# Patient Record
Sex: Female | Born: 1970 | Race: Black or African American | Hispanic: No | Marital: Single | Smoking: Never smoker
Health system: Southern US, Community
[De-identification: ages and names within clinical notes are randomized; demographics above are authoritative.]

## PROBLEM LIST (undated history)

## (undated) DIAGNOSIS — K219 Gastro-esophageal reflux disease without esophagitis: Secondary | ICD-10-CM

## (undated) DIAGNOSIS — J4520 Mild intermittent asthma, uncomplicated: Secondary | ICD-10-CM

## (undated) DIAGNOSIS — H7290 Unspecified perforation of tympanic membrane, unspecified ear: Secondary | ICD-10-CM

## (undated) DIAGNOSIS — Z87828 Personal history of other (healed) physical injury and trauma: Secondary | ICD-10-CM

## (undated) DIAGNOSIS — J302 Other seasonal allergic rhinitis: Secondary | ICD-10-CM

## (undated) DIAGNOSIS — H5210 Myopia, unspecified eye: Secondary | ICD-10-CM

## (undated) DIAGNOSIS — J019 Acute sinusitis, unspecified: Secondary | ICD-10-CM

## (undated) DIAGNOSIS — H52209 Unspecified astigmatism, unspecified eye: Secondary | ICD-10-CM

## (undated) DIAGNOSIS — J3089 Other allergic rhinitis: Secondary | ICD-10-CM

## (undated) DIAGNOSIS — M25862 Other specified joint disorders, left knee: Secondary | ICD-10-CM

## (undated) DIAGNOSIS — Z8742 Personal history of other diseases of the female genital tract: Secondary | ICD-10-CM

## (undated) DIAGNOSIS — E663 Overweight: Secondary | ICD-10-CM

## (undated) DIAGNOSIS — J309 Allergic rhinitis, unspecified: Secondary | ICD-10-CM

## (undated) DIAGNOSIS — M25571 Pain in right ankle and joints of right foot: Secondary | ICD-10-CM

## (undated) DIAGNOSIS — K279 Peptic ulcer, site unspecified, unspecified as acute or chronic, without hemorrhage or perforation: Secondary | ICD-10-CM

## (undated) HISTORY — DX: Personal history of other diseases of the female genital tract: Z87.42

## (undated) HISTORY — DX: Other seasonal allergic rhinitis: J30.2

## (undated) HISTORY — DX: Mild intermittent asthma, uncomplicated: J45.20

## (undated) HISTORY — DX: Overweight: E66.3

## (undated) HISTORY — DX: Allergic rhinitis, unspecified: J30.9

## (undated) HISTORY — DX: Myopia, unspecified eye: H52.10

## (undated) HISTORY — DX: Acute sinusitis, unspecified: J01.90

## (undated) HISTORY — DX: Gastro-esophageal reflux disease without esophagitis: K21.9

## (undated) HISTORY — DX: Pain in right ankle and joints of right foot: M25.571

## (undated) HISTORY — DX: Unspecified astigmatism, unspecified eye: H52.209

## (undated) HISTORY — DX: Personal history of other (healed) physical injury and trauma: Z87.828

## (undated) HISTORY — DX: Other specified joint disorders, left knee: M25.862

## (undated) HISTORY — DX: Unspecified perforation of tympanic membrane, unspecified ear: H72.90

## (undated) HISTORY — DX: Other allergic rhinitis: J30.89

## (undated) HISTORY — DX: Peptic ulcer, site unspecified, unspecified as acute or chronic, without hemorrhage or perforation: K27.9

---

## 2003-05-17 ENCOUNTER — Emergency Department (HOSPITAL_COMMUNITY): Admission: EM | Admit: 2003-05-17 | Discharge: 2003-05-17 | Payer: Self-pay | Admitting: Emergency Medicine

## 2004-07-10 ENCOUNTER — Encounter: Payer: Self-pay | Admitting: Family Medicine

## 2004-07-10 ENCOUNTER — Ambulatory Visit: Payer: Self-pay | Admitting: Family Medicine

## 2004-07-24 ENCOUNTER — Ambulatory Visit: Payer: Self-pay | Admitting: Family Medicine

## 2004-07-30 ENCOUNTER — Ambulatory Visit (HOSPITAL_COMMUNITY): Admission: RE | Admit: 2004-07-30 | Discharge: 2004-07-30 | Payer: Self-pay | Admitting: Family Medicine

## 2004-11-03 ENCOUNTER — Ambulatory Visit: Payer: Self-pay | Admitting: Family Medicine

## 2005-06-22 ENCOUNTER — Encounter (INDEPENDENT_AMBULATORY_CARE_PROVIDER_SITE_OTHER): Payer: Self-pay | Admitting: *Deleted

## 2005-06-22 LAB — CONVERTED CEMR LAB

## 2005-07-20 ENCOUNTER — Encounter: Payer: Self-pay | Admitting: Family Medicine

## 2005-07-20 ENCOUNTER — Ambulatory Visit: Payer: Self-pay | Admitting: Family Medicine

## 2005-07-21 ENCOUNTER — Encounter: Admission: RE | Admit: 2005-07-21 | Discharge: 2005-07-21 | Payer: Self-pay | Admitting: Family Medicine

## 2005-11-09 ENCOUNTER — Ambulatory Visit: Payer: Self-pay | Admitting: Family Medicine

## 2005-11-10 ENCOUNTER — Encounter: Admission: RE | Admit: 2005-11-10 | Discharge: 2005-11-10 | Payer: Self-pay | Admitting: Family Medicine

## 2005-12-20 HISTORY — PX: TOTAL ABDOMINAL HYSTERECTOMY W/ BILATERAL SALPINGOOPHORECTOMY: SHX83

## 2006-01-11 ENCOUNTER — Encounter (INDEPENDENT_AMBULATORY_CARE_PROVIDER_SITE_OTHER): Payer: Self-pay | Admitting: Specialist

## 2006-01-11 ENCOUNTER — Inpatient Hospital Stay (HOSPITAL_COMMUNITY): Admission: RE | Admit: 2006-01-11 | Discharge: 2006-01-13 | Payer: Self-pay | Admitting: Obstetrics & Gynecology

## 2006-08-19 DIAGNOSIS — Z8742 Personal history of other diseases of the female genital tract: Secondary | ICD-10-CM

## 2006-08-19 HISTORY — DX: Personal history of other diseases of the female genital tract: Z87.42

## 2006-08-20 ENCOUNTER — Encounter (INDEPENDENT_AMBULATORY_CARE_PROVIDER_SITE_OTHER): Payer: Self-pay | Admitting: *Deleted

## 2006-10-04 ENCOUNTER — Ambulatory Visit: Payer: Self-pay | Admitting: Family Medicine

## 2006-10-04 LAB — CONVERTED CEMR LAB
HDL: 88 mg/dL (ref >39)
Hemoglobin: 13.7 g/dL
LDL Cholesterol: 83 mg/dL (ref 0–99)
Platelets: 299 10*3/uL
Triglycerides: 64 mg/dL (ref <150)
VLDL: 13 mg/dL (ref 0–40)

## 2006-10-06 ENCOUNTER — Encounter: Payer: Self-pay | Admitting: Family Medicine

## 2007-02-24 ENCOUNTER — Ambulatory Visit: Payer: Self-pay | Admitting: Family Medicine

## 2007-02-24 DIAGNOSIS — N959 Unspecified menopausal and perimenopausal disorder: Secondary | ICD-10-CM | POA: Insufficient documentation

## 2007-10-13 ENCOUNTER — Ambulatory Visit: Payer: Self-pay | Admitting: Family Medicine

## 2007-10-14 ENCOUNTER — Telehealth: Payer: Self-pay | Admitting: *Deleted

## 2007-10-14 DIAGNOSIS — J4520 Mild intermittent asthma, uncomplicated: Secondary | ICD-10-CM | POA: Insufficient documentation

## 2007-10-14 DIAGNOSIS — J309 Allergic rhinitis, unspecified: Secondary | ICD-10-CM

## 2007-10-14 DIAGNOSIS — H1010 Acute atopic conjunctivitis, unspecified eye: Secondary | ICD-10-CM

## 2007-10-14 DIAGNOSIS — K279 Peptic ulcer, site unspecified, unspecified as acute or chronic, without hemorrhage or perforation: Secondary | ICD-10-CM

## 2007-10-14 DIAGNOSIS — J302 Other seasonal allergic rhinitis: Secondary | ICD-10-CM

## 2007-10-14 DIAGNOSIS — K219 Gastro-esophageal reflux disease without esophagitis: Secondary | ICD-10-CM

## 2007-10-14 DIAGNOSIS — Z862 Personal history of diseases of the blood and blood-forming organs and certain disorders involving the immune mechanism: Secondary | ICD-10-CM | POA: Insufficient documentation

## 2007-10-14 HISTORY — DX: Other allergic rhinitis: J30.2

## 2007-10-14 HISTORY — DX: Gastro-esophageal reflux disease without esophagitis: K21.9

## 2007-10-14 HISTORY — DX: Allergic rhinitis, unspecified: J30.9

## 2007-10-14 HISTORY — DX: Peptic ulcer, site unspecified, unspecified as acute or chronic, without hemorrhage or perforation: K27.9

## 2009-01-09 ENCOUNTER — Encounter: Payer: Self-pay | Admitting: Family Medicine

## 2009-01-10 ENCOUNTER — Ambulatory Visit: Payer: Self-pay | Admitting: Family Medicine

## 2009-01-10 DIAGNOSIS — E663 Overweight: Secondary | ICD-10-CM | POA: Insufficient documentation

## 2009-01-10 HISTORY — DX: Overweight: E66.3

## 2010-01-30 DIAGNOSIS — Z87828 Personal history of other (healed) physical injury and trauma: Secondary | ICD-10-CM

## 2010-01-30 HISTORY — DX: Personal history of other (healed) physical injury and trauma: Z87.828

## 2010-02-13 ENCOUNTER — Ambulatory Visit: Payer: Self-pay | Admitting: Family Medicine

## 2010-02-13 LAB — CONVERTED CEMR LAB: Triglycerides: 56 mg/dL (ref <150)

## 2010-02-14 ENCOUNTER — Encounter: Payer: Self-pay | Admitting: Family Medicine

## 2010-07-22 NOTE — Letter (Signed)
Summary: [facility] Lipid Letter  [facility]  [address]   [place], [place] [address]   Phone: [phone]  Fax: [phone]    02/14/2010 MRN: [id number]  Sara Patrick [address] [place], [place]  [address]  Dear [name]:  Here is your Cholesterol profile from 02/13/2010 and the results are noted below with a summary of recommendations for lipid management.  Your level of the LDL "Bad" cholesterol is low and your level of the HDL "Good" cholesterol is high.  This is exactly what you want for good health.  Keep up the good work!    Total Cholesterol:     170     Goal is less than 200   HDL "good" Cholesterol:   70     Goal is more than 40   LDL "bad" Cholesterol:   89     Goal is less than 160   Triglycerides:       56     Goal is less than 150  If you have any questions, please call. We appreciate being able to work with you.   Sincerely,   Tawanna Cooler McDiarmid MD [facility]      Appended Document: [facility] Lipid Letter mailed

## 2010-07-22 NOTE — Assessment & Plan Note (Signed)
Summary: cpe,df   Vital Signs:  Patient profile:   41 year old female Height:      62.75 inches Weight:      143 pounds BMI:     25.63 BSA:     1.67 Temp:     98.3 degrees F Pulse rate:   70 / minute BP sitting:   109 / 77  Vitals Entered By: Jone Baseman CMA (February 13, 2010 9:07 AM)  History of Present Illness: Hormonal Replacement Therpay No vasomotor symptoms No dysuria No chest pain, No SOB, No leg swelling.  Pt does not smoke.   generic patch slips off easily, she tapes them down successfully  Allergic Rhinitis Good response to Flonase uses every day. No sneezing, nasal drainage, coughing, watery eyes, nasal congestion.  Denies both epistaxis and nasal burning with use of Flonase.  .  Asthma Used two puffs of albuterol MDI about 5 times in last year with good response.  Usually when she has a head cold No nocturnal symptoms, No wheezing, Able to do Tai Bo without cough/SOB/wheeze. Occassional 1-2 sec palpitation, no oral lesions.  GERD Has been taking omeprazole 20 mg daily with good symptom control.  No heartburn/indigestion, no brash water, no nausea.  She has not had to use any OTC antacids.  Denies melena and hematochezia.   Overwieght Has lost 8 pounds since last year with Diet and exercise.  Walking at least 30 minutes every day and frequently does Tai Bo   Left ankle sprian  2 weeks ago.  Inversion ankle injury going down steps.  Seen at an urgent care an treated with ASO brace.  Improving.  Less painful.  Performing ROM exercising;.      Habits & Providers  Alcohol-Tobacco-Diet     Alcohol drinks/day: 0     Tobacco Status: never  Current Medications (verified): 1)  Climara 0.1 Mg/24hr  Ptwk (Estradiol) .... Apply One Patch A Week Disp: # 4 Patches 2)  Flonase 50 Mcg/act  Susp (Fluticasone Propionate) .... 2 Sprays Each Nostril Daily For Two Weeks, Then One Spray Each Nostril Daily 3)  Ventolin Hfa 108 (90 Base) Mcg/act  Aers (Albuterol Sulfate)  .... 2 Sprays Inhaled Four Times A Day As Needed For Shortness of Breath or Wheezing 4)  Omeprazole 20 Mg Cpdr (Omeprazole) .... One Pill Daily  Allergies (verified): No Known Drug Allergies  Past History:  Past Medical History: Hx of Peptic Ulcer Hx of overuse of BC Powders 1/06, Hx of Uterine fibroids (multiple) by TVUS 03/07 Asthma, intermittent Farsighted- wears spectacles for driving  Past Surgical History: TVUS(03/05)L.cystsl.complex,multfibroids - 11/09/2005 Hysterectomy: Fibroids &L.ovarian endometrriosis, adenomyosis (07/07, Dr Yolanda Bonine [OBGYN])  Oophorectomy: Bilateral 01/13/06  Physical Exam  General:  Well-developed,well-nourished,in no acute distress; alert,appropriate and cooperative throughout examination Eyes:  No corneal or conjunctival inflammation noted. EOMI. Perrla. Red Reflex present bilaterally. Normal cup disc ratio.no optic disk abnormalities and no retinal abnormalitiies.   Neck:  supple, no masses, no thyromegaly, and no thyroid nodules or tenderness.   Lungs:  normal respiratory effort, no accessory muscle use, normal breath sounds, no crackles, and no wheezes.   Heart:  normal rate, regular rhythm, no murmur, and no JVD.   Abdomen:  soft, non-tender, normal bowel sounds, no distention, no masses, no hepatomegaly, and no splenomegaly.     Foot/Ankle Exam  Ankle Exam:    Right:    Inspection:  Abnormal    Palpation:  Abnormal    Stability:  stable  Anterior ankle with faint bluish resolving bruise. Tender over anterior talar joint.  No pain with resisted ankle dorsiflexion. No swelling.  Full ankle ROM.      Impression & Recommendations:  Problem # 1:  POSTMENOPAUSAL SYNDROME (ICD-627.9) Assessment Unchanged  Adequate control. Tolerating medication.  Plan to continue current medication until normal age on menopause. Her updated medication list for this problem includes:    Climara 0.1 Mg/24hr Ptwk (Estradiol) .Marland Kitchen... Apply one patch a  week disp: # 4 patches  Orders: [facility]- Est  Level 4 (24401)  Problem # 2:  OVERWEIGHT (ICD-278.02) Assessment: Improved Now in acceptable BMI range.  Continue current diet and exercise regimant.   Problem # 3:  ASTHMA, INTERMITTENT, MILD (ICD-493.90) Assessment: Unchanged  Adequate control. Tolerating medication. No new organ damage. Plan to continue current medication.  Her updated medication list for this problem includes:    Ventolin Hfa 108 (90 Base) Mcg/act Aers (Albuterol sulfate) .Marland Kitchen... 2 sprays inhaled four times a day as needed for shortness of breath or wheezing  Orders: [facility]- Est  Level 4 (02725)  Problem # 4:  RHINITIS, ALLERGIC (ICD-477.9) Assessment: Unchanged  Adequate control. Tolerating medication. No new organ damage. Plan to continue current medication.  Her updated medication list for this problem includes:    Flonase 50 Mcg/act Susp (Fluticasone propionate) .Marland Kitchen... 2 sprays each nostril daily for two weeks, then one spray each nostril daily  Orders: [facility]- Est  Level 4 (99214)  Problem # 5:  GASTROESOPHAGEAL REFLUX, NO ESOPHAGITIS (ICD-530.81)  Adequate control. Tolerating medication. No new organ damage. Plan to continue current medication.  Her updated medication list for this problem includes:    Omeprazole 20 Mg Cpdr (Omeprazole) ..... One pill daily  Orders: [facility]- Est  Level 4 (36644)  Problem # 6:  ANKLE SPRAIN, RIGHT (ICD-845.00) Assessment: Improved May stop wearing ASO except in situation and activities. where repeat ankle inversion injury is possible.  Continue ankle range of motion exercises three times a  for four more weeks.   Problem # 7:  Screening Breast Cancer (ICD-V76.10) Information given for mammogram screening at [facility].  Patient was instructed to arrange her mammogram now then every 1 to 2 years in age range 45 to 41 years old.   Complete Medication List: 1)  Climara 0.1 Mg/24hr Ptwk (Estradiol) .... Apply one patch a week  disp: # 4 patches 2)  Flonase 50 Mcg/act Susp (Fluticasone propionate) .... 2 sprays each nostril daily for two weeks, then one spray each nostril daily 3)  Ventolin Hfa 108 (90 Base) Mcg/act Aers (Albuterol sulfate) .... 2 sprays inhaled four times a day as needed for shortness of breath or wheezing 4)  Omeprazole 20 Mg Cpdr (Omeprazole) .... One pill daily  Other Orders: Lipid-[facility] ([id number])  Patient Instructions: 1)  Please schedule a follow-up appointment in 1 year.  2)  Good job with your weight loss! Prescriptions: OMEPRAZOLE 20 MG CPDR (OMEPRAZOLE) One pill daily  #90 each x PRN   Entered and Authorized by:   Tawanna Cooler McDiarmid MD   Signed by:   Tawanna Cooler McDiarmid MD on 02/13/2010   Method used:   Electronically to        [id number]* (retail)       [address] [place], [place]  [address]       Ph: [phone]       Fax: [phone]   RxID:   [id number] VENTOLIN HFA  108 (90 BASE) MCG/ACT  AERS (ALBUTEROL SULFATE) 2 sprays inhaled four times a day as needed for shortness of breath or wheezing  #1 x PRN   Entered and Authorized by:   Tawanna Cooler McDiarmid MD   Signed by:   Tawanna Cooler McDiarmid MD on 02/13/2010   Method used:   Electronically to        [id number]* (retail)       [address] [place], [place]  [address]       Ph: [phone]       Fax: [phone]   RxID:   [id number] FLONASE 50 MCG/ACT  SUSP (FLUTICASONE PROPIONATE) 2 sprays each nostril daily for two weeks, then one spray each nostril daily  #1 x PRN   Entered and Authorized by:   Tawanna Cooler McDiarmid MD   Signed by:   Tawanna Cooler McDiarmid MD on 02/13/2010   Method used:   Electronically to        [id number]* (retail)       [address] [place], [place]  [address]       Ph: [phone]       Fax: [phone]   RxID:   [id number] CLIMARA 0.1 MG/24HR  PTWK (ESTRADIOL) apply one patch a week Disp: # 4 patches  #4 Each x PRN   Entered and  Authorized by:   Tawanna Cooler McDiarmid MD   Signed by:   Tawanna Cooler McDiarmid MD on 02/13/2010   Method used:   Electronically to        [id number]* (retail)       [address] [place], [place]  [address]       Ph: [phone]       Fax: [phone]   RxID:   [id number]   Last PAP:  Done. (06/22/2005 12:00:00 AM) PAP Next Due:  Not Indicated

## 2010-07-28 ENCOUNTER — Encounter: Payer: Self-pay | Admitting: *Deleted

## 2010-11-07 NOTE — Op Note (Signed)
Sara Patrick, Sara Patrick                  ACCOUNT NO.:  [id number]   MEDICAL RECORD NO.:  [id number]          PATIENT TYPE:  INP   LOCATION:  9399                          [facility]   PHYSICIAN:  M. Leda Quail, MD  DATE OF BIRTH:  06/12/1971   DATE OF PROCEDURE:  DATE OF DISCHARGE:                                 OPERATIVE REPORT   PREOPERATIVE DIAGNOSES:  24. A 40 year old, G2, A2 African-American female with symptomatic uterine      fibroids.  2. History of iron deficiency anemia.  3. Multiple large fibroids with the largest being a fundal fibroid      measuring 6.5 cm.  4. Complex left ovarian cyst, possible endometrioma on ultrasound.   POSTOPERATIVE DIAGNOSES:  52. A 40 year old, G2, A2 African-American female with symptomatic uterine      fibroids.  2. History of iron deficiency anemia.  3. Multiple large fibroids with the largest being a fundal fibroid      measuring 6.5 cm.  4. Complex left ovarian cyst, possible endometrioma on ultrasound.  5. Endometrium on the left ovary and pelvic endometriosis.   PROCEDURE:  Total abdominal hysterectomy, bilateral salpingo-oophorectomy.   SURGEON:  M. Leda Quail, MD.   ASSISTANTAram Beecham P. Romine, MD.   ANESTHESIA:  General endotracheal.   ASSESSMENT:  Uterus, cervix and tubes, ovaries to pathology.   ESTIMATED BLOOD LOSS:  350 mL.   URINE OUTPUT:  400 mL of clear urine in Foley catheter.   FLUIDS:  2000 mL of LR.   COMPLICATIONS:  None.   INDICATIONS FOR PROCEDURE:  [name] is a very pleasant, 40 year old, G2,  P2, African-American female who has a quite enlarged uterus about 12-14  weeks a large fundal fibroid that measures 6.5 cm. She is symptomatic with  this including pelvic pressure and some back pain as well as a history of  heavier menstrual cycles. She had some iron deficiency anemia in the past  but with iron her hemoglobin is improved. However, she continues to have  symptoms because of the size of  the uterus. She has decided that she is not  going to be desirous of children in the future and has therefore opted for  definitive treatment.   DESCRIPTION OF PROCEDURE:  The patient was taken to the operating room, she  was placed in the supine position. Pillows placed under her knees. Legs are  positioned in the frog leg position. The abdomen, perineum, inner thighs and  vagina are prepped and draped in normal sterile fashion. A Pfannenstiel skin  incision made with a knife and carried down through subcutaneous fatty  tissue. The fascia was nicked in the midline and the fascial incision  extended laterally using curved Mayo scissors. Kocher clamps are applied to  the superior aspect of the fascial incision and the underlying rectus  muscles dissected off sharply.  In a similar fashion, Kocher clamps are  applied to the inferior aspect of the fascial incision and the underlying  rectus muscles were dissected off sharply. The rectus muscles are divided in  the midline. It  appeared the preperitoneal fatty tissue was elevated and  incised used a knife. The peritoneum was incised as well using a knife. The  peritoneum is divided, attention paid to the location of the bladder. The  uterus is grossly enlarged with again a multiple large fibroid on the top of  the uterus. The left ovary is stuck to the posterior aspect of the uterus  and fallopian tube is wrapped around the ovary. There does appear to be a  cyst on the posterior aspect of the uterus which most likely is an  endometrioma. There is some endometriosis on the posterior cul-de-sac and on  the uterosacral ligaments as well. A retractor was placed in the abdomen  with care taken to ensure that the blades are not on the psoas muscles. A  bladder blade was applied. Then the laparotomy sponges are used to pack the  bowel anteriorly. The patient is placed in a little bit of Trendelenburg.  Then [employer] clips are placed around the uterine  ovarian ligaments. The round  ligaments are suture ligated bilaterally and the round ligaments were  incised. The anterior leaf of the broad ligament was incised and brought to  the level of the internal os beginning where the bladder flap was created.  Then on the left side, the uterine ovarian ligament was isolated, clamped  with a Heaney clamp and transected. The Kelly retractor was used to control  back bleeding. This uterine ovarian ligament pedicle on the left was tied  first with a free tie of #0 Vicryl and then second with a stitch tie. There  was some redundant peritoneum around the ovary and the adhesion on the  posterior aspect of the uterus. This was freed  using careful dissection. Of  note, the ureter was noted on the left side and was well below the level of  the IP ligament. The right ureter was also identified at this point. The  right uterine ovarian ligament was actually very difficult to isolate  because of the uterus and the fibroids. A decision was made to go ahead and  take the right ovary as well due to the endometriosis. Therefore, the right  ureter was identified. The right IP ligament was isolated, clamped with a  Heaney clamp. Again the Kelly clamp was used to control back bleeding. The  pedicle was transected and the pedicle was suture ligated with a free tie of  #0 Vicryl and with a second stich tie. Then the attention was turned to the  bladder flap. The pubovesicocervical fascia was incised and the bladder flap  was created. The bladder flap was created. The bladder was then pushed down  the cervix away from the uterus. The uterine arteries were skeletonized  bilaterally and curved Heaney clamps were placed across the uterine arteries  at the level of the internal os. Pedicles were tied twice with #0 Vicryl  stitches. Then the uterus because of its size and difficulty in seeing the surgical field was amputated. Kocher clamps were used to hold the anterior   and posterior aspect of the cervix. The cardinal ligaments were serially  clamped, transected, and suture ligated with #0 Vicryl. The bladder was  again pushed further down the cervix as necessary to keep it out of the way  of the surgery. Then at the base of the cervix, curved Heaney clamps are  applied across the uterosacral ligaments. These pedicles are transected and  the uterosacral ligaments were Heaney stitched with #0 Vicryl and the  stitch  was left long. The cervix was then circumscribed with Mayo scissors and the  cervix was handed off intact to be sent to pathology. A suture was placed at  the corner of the vaginal opening and attached to the uterosacral ligament  to suspend this corner and to ensure excellent hemostasis. The vaginal cuff  was closed with two figure-of-eight sutures of #0 Vicryl. At this point, the  left ovary was completely inspected and it did appear there was indeed an  endometrioma present. Therefore this ovary as well was taken. The left IP  ligament was isolated, curved Heaney clamp was placed across this ligament.  The pedicle was incised and the specimen was handed off. The left IP  ligament was then suture ligated first with free tie of #0 Vicryl and second  with a stitch tie. At this point, the pelvis was irrigated with copious  amounts of warm normal saline. There is a small amount of bleeding on the  posterior aspect of the cervix where the posterior peritoneum was located.  Hemostasis was achieved with cautery. Bilateral ureter peristalsis was  noted. The IP ligaments were hemostatic. The round ligaments were  hemostatic, the cuff was hemostatic, the cuff was hemostatic, all sutures  were cut short. The retractor was removed and the bladder blade was removed  and the 3 laparotomy sponges were removed. The bowel was placed back in in  situ position.   The peritoneum was closed with a running suture of #0 Vicryl. An On-Q pump  tubing was then placed  subfascially on the right side. Then the fascia was  closed with a running stitch of #0 Vicryl placed from the corner to the  midline. The subcutaneous fatty tissue was irrigated then the second On-Q  pump tubing was placed subcutaneously on the patient's left side. The tubing  was kept in place with Steri-Strips and Opsite dressings were applied. Then  the incision was closed with subcuticular stitch of 4-0 Vicryl. Benzoin and  Steri-Strips were applied.   Sponge, lap, needle and instrument counts were correct x2. The patient  tolerated the procedure well. She was taken to the recovery room in stable  condition.      Lum Keas, MD  Electronically Signed     MSM/MEDQ  D:  01/11/2006  T:  01/11/2006  Job:  [id number]

## 2010-11-07 NOTE — Discharge Summary (Signed)
NAMEMONTINA, Sara                  ACCOUNT NO.:  [id number]   MEDICAL RECORD NO.:  [id number]          PATIENT TYPE:  INP   LOCATION:  9304                          [facility]   PHYSICIAN:  M. Leda Quail, MD  DATE OF BIRTH:  06-11-1971   DATE OF ADMISSION:  01/11/2006  DATE OF DISCHARGE:  01/13/2006                                 DISCHARGE SUMMARY   ADMISSION DIAGNOSES:  37. A 40 year old, G2, A1, African-American female with symptomatic uterine      fibroids.  2. Pelvic pressure.  3. History of iron deficiency anemia.   DISCHARGE DIAGNOSES:  1. Symptomatic uterine fibroids.  2. History of iron deficiency anemia.  3. Endometriosis with a left endometrioma on the ovary.   PROCEDURE:  TAH/BSO.   HISTORY OF PRESENT ILLNESS:  A written H&P is in the chart but in brief, Sara Patrick is a very pleasant 40 year old, G2, A1, African-American female who has  a history of symptomatic uterine fibroids including pelvic pressure, low  back pain, and history of iron deficiency anemia. Her uterus is  approximately 14 weeks in size and has multiple fibroids including a 6.5 cm  fundal fibroid. The patient has decided that she does not want to have  children and requests definitive treatment.   HOSPITAL COURSE:  The patient was admitted through same day surgery, she was  taken to the OR where a TAH, possible LSO was planned. During the procedure,  a quite enlarged uterus with multiple fibroids was noted. The left complex  cyst that was present on the ovary was actually an endometrioma until the  left ovary and tube were removed. However, there was a significant amount of  adhesion around the right tube and ovary as well. A decision was made to go  ahead and remove this for complete treatment of the diseased process.  Therefore, she underwent a TAH/BSO without difficulty. She had about 350 mL  of blood loss during the surgery. She otherwise did well and after an  appropriate time in the  recovery room was taken to the third floor for  postop recovery. In the evening of postoperative day zero, she was doing  well except she had some mild nausea. Her pain was under good control, vital  signs were stable, she was afebrile. She had decreased bowel sounds but  otherwise physical examination was normal. She did not have any  inappropriate bleeding from her incision sites. She did an On-Q pump that  was placed in the OR for pain control and was using a morphine PCA as well  as received Toradol. By postoperative day #1, she had excellent pain control  and her nausea had completely resolved. Tmax was 99.1 otherwise vital signs  were stable. She made 3100 mL of urine output. Abdomen was soft, nontender,  nondistended and she had excellent bowel sounds. Perineum was dry. The Foley  catheter was removed. She was advanced to a regular diet without difficulty.  Her IV was removed, the PCA was removed and she was started on oral pain  medications. She did very  well this day and was able to be up and ambulate  and pass flatus. By the morning of postoperative day #2, she was tolerating  a regular diet, ambulating, voiding without difficulty and taking all  medications orally. Again vital signs were stable, she was afebrile, her  exam was completely benign. At this point, discharge was felt appropriate.   The patient is discharged to home with family. She has prescriptions for  Percocet and Motrin to be used for pain. She is given discharge instructions  in the oral and written form and she has a postop appointment with me on  July 31 at 1 p.m. in the afternoon. She is aware of this.      Lum Keas, MD  Electronically Signed     MSM/MEDQ  D:  01/13/2006  T:  01/13/2006  Job:  [id number]

## 2010-12-26 ENCOUNTER — Other Ambulatory Visit: Payer: Self-pay | Admitting: Family Medicine

## 2010-12-26 DIAGNOSIS — Z1231 Encounter for screening mammogram for malignant neoplasm of breast: Secondary | ICD-10-CM

## 2011-01-02 ENCOUNTER — Ambulatory Visit
Admission: RE | Admit: 2011-01-02 | Discharge: 2011-01-02 | Disposition: A | Discharge status: Home / Self Care | Payer: BC Managed Care – PPO | Source: Ambulatory Visit | Attending: Family Medicine | Admitting: Family Medicine

## 2011-01-02 DIAGNOSIS — Z1231 Encounter for screening mammogram for malignant neoplasm of breast: Secondary | ICD-10-CM

## 2011-02-16 ENCOUNTER — Encounter: Payer: Self-pay | Admitting: Family Medicine

## 2011-02-16 ENCOUNTER — Ambulatory Visit (INDEPENDENT_AMBULATORY_CARE_PROVIDER_SITE_OTHER): Payer: BC Managed Care – PPO | Admitting: Family Medicine

## 2011-02-16 DIAGNOSIS — M25571 Pain in right ankle and joints of right foot: Secondary | ICD-10-CM

## 2011-02-16 DIAGNOSIS — N959 Unspecified menopausal and perimenopausal disorder: Secondary | ICD-10-CM

## 2011-02-16 DIAGNOSIS — Z13220 Encounter for screening for lipoid disorders: Secondary | ICD-10-CM

## 2011-02-16 DIAGNOSIS — K219 Gastro-esophageal reflux disease without esophagitis: Secondary | ICD-10-CM

## 2011-02-16 DIAGNOSIS — E663 Overweight: Secondary | ICD-10-CM

## 2011-02-16 DIAGNOSIS — Z131 Encounter for screening for diabetes mellitus: Secondary | ICD-10-CM

## 2011-02-16 DIAGNOSIS — J45909 Unspecified asthma, uncomplicated: Secondary | ICD-10-CM

## 2011-02-16 DIAGNOSIS — M25579 Pain in unspecified ankle and joints of unspecified foot: Secondary | ICD-10-CM

## 2011-02-16 LAB — LIPID PANEL
Cholesterol: 182 mg/dL (ref 0–200)
LDL Cholesterol: 97 mg/dL (ref 0–99)
Total CHOL/HDL Ratio: 2.7 Ratio
Triglycerides: 87 mg/dL (ref <150)
VLDL: 17 mg/dL (ref 0–40)

## 2011-02-16 LAB — BASIC METABOLIC PANEL
CO2: 24 mEq/L (ref 19–32)
Chloride: 101 mEq/L (ref 96–112)
Potassium: 3.8 mEq/L (ref 3.5–5.3)
Sodium: 138 mEq/L (ref 135–145)

## 2011-02-16 MED ORDER — ALBUTEROL SULFATE HFA 108 (90 BASE) MCG/ACT IN AERS: 2.0000 | INHALATION_SPRAY | RESPIRATORY_TRACT | Status: DC | PRN

## 2011-02-16 MED ORDER — OMEPRAZOLE 20 MG PO CPDR: 20.0000 mg | DELAYED_RELEASE_CAPSULE | Freq: Every day | ORAL | Status: DC

## 2011-02-16 MED ORDER — FLUTICASONE PROPIONATE 50 MCG/ACT NA SUSP: 1.0000 | Freq: Every day | NASAL | Status: DC

## 2011-02-16 MED ORDER — CLIMARA 0.1 MG/24HR TD PTWK: 1.0000 | MEDICATED_PATCH | TRANSDERMAL | Status: DC

## 2011-02-16 NOTE — Progress Notes (Signed)
  Subjective:    Patient ID: Sara Patrick, female    DOB: 06-Aug-1970, 40 y.o.   MRN: [id number]  HPI History of Present Illness: Hormonal Replacement Therpay No vasomotor symptoms No dysuria No chest pain, No SOB, No leg swelling.  Pt does not smoke.   generic patch slips off easily, she tapes them down successfully  Allergic Rhinitis Good response to Flonase uses every day. No sneezing, nasal drainage, coughing, watery eyes, nasal congestion.  Denies both epistaxis and nasal burning with use of Flonase.  .  Asthma Used two puffs of albuterol MDI about 3 to 4 times in last year with good response.  Usually when she has a head cold. No nocturnal symptoms, No wheezing, Able to do Tai Bo without experiencing shortness of breath. Occassional 1-2 sec palpitation, no oral lesions.  GERD Has been taking omeprazole 20 mg daily with good symptom control. Has heartburn if skips a day of omeprazole.  No heartburn/indigestion,   She has not had to use any OTC antacids.  Denies melena and hematochezia.   Overwieght Has gained about 11 pounds since last year with Diet and exercise.  Walking at least 30 minutes every day and frequently does Tai Bo. Denies any change in diet.   Left ankle sprian   Inversion ankle injury going down steps one year ago.  Seen at an urgent care an treated with ASO brace.  Improving.  Less painful.  Performing ROM exercising; Has stopped wearing her ASO brace. 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 Pain relief after gets off feet at end of day.  Aching pain.  No swelling of ankle.  No new trauma to foot. No numbness or tingling in foot.     Review of Systems     Objective:   Physical Exam  Constitutional: Vital signs are normal. She appears well-developed and well-nourished. No distress.  Neck: No mass and no thyromegaly present.  Cardiovascular: Normal rate, regular rhythm and normal  heart sounds.   No murmur heard. Pulmonary/Chest: Effort normal and breath sounds normal.  Abdominal: Soft. Normal appearance and normal aorta. There is no hepatosplenomegaly. There is tenderness.  Neurological: She is alert.   Right Foot: loss of longitudinal arch compared to left foot. Excess pronation at ankle. Tender along inferior pole and posterior edge of medial malleolus.  No overlying erythema or edema. Pain with Plantar flexion. Stable lateral and medial tilt of ankle.          Assessment & Plan:

## 2011-02-17 ENCOUNTER — Encounter: Payer: Self-pay | Admitting: Family Medicine

## 2011-02-17 DIAGNOSIS — M25571 Pain in right ankle and joints of right foot: Secondary | ICD-10-CM

## 2011-02-17 HISTORY — DX: Pain in right ankle and joints of right foot: M25.571

## 2011-02-17 NOTE — Assessment & Plan Note (Signed)
Adequate control. Using albuterol rescue only 4 to 5 times a year, primarily with head colds.

## 2011-02-17 NOTE — Assessment & Plan Note (Signed)
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  Recommended OTC longitudinal arch support use.  Patient will let me know if not improving, would then refer to [facility] for consideration of custom orthotics.

## 2011-02-17 NOTE — Assessment & Plan Note (Signed)
Adequate symptomatic control. Tolerating medication without adverse effects. Pt notices exacerbation of symptoms should she miss a day of omeprazole.  Plan to continue daily omeprazole.

## 2011-02-17 NOTE — Assessment & Plan Note (Signed)
Adequate vasomotor symptom control.  Her generic Climara patch does not adhere well, she has to tape patch down most of the time.  She would like to return to using the Climara Brand name patch as it did not have the problem with falling off like the generic patch. Will refill Climara patch Brand name, Dispense as Written.

## 2011-02-17 NOTE — Assessment & Plan Note (Signed)
Reviewed weight increase over last few years and particularly over last year.  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  Patient feels she knows how to respond to the weight gain and ethnicity. Her serum glucose was screened given her age and BMI over 25%.  It was within normal limits.  Plan recheck in 3 years.

## 2011-03-12 ENCOUNTER — Other Ambulatory Visit: Payer: Self-pay | Admitting: Family Medicine

## 2011-03-13 NOTE — Telephone Encounter (Signed)
Refill request

## 2011-05-22 ENCOUNTER — Telehealth: Payer: Self-pay | Admitting: Family Medicine

## 2011-05-22 NOTE — Telephone Encounter (Signed)
There has been a manufacturing problem with the Climara patches and the Pharmacy wants to know if there is something else she can use instead.

## 2011-05-22 NOTE — Telephone Encounter (Signed)
There are no other once weekly estradiol patches that have the same amount of estradiol hormone as Climara.   Alora is an estradiol patch that has to be applied twice a week instead of once a week like Climara.  Alora would supply a similar amount of estradiol as Climara.   I can send in a prescription for Alora patch if Sara Patrick is willing to change.   Please ask patient if she is interested in trying Alora patch

## 2011-05-25 NOTE — Telephone Encounter (Signed)
Pt called back and is willing to try the other patch.

## 2011-05-25 NOTE — Telephone Encounter (Signed)
LMOVM informing her of 2x weekly application and to call back if she is interested. Fleeger, Maryjo Rochester

## 2011-05-26 MED ORDER — ESTRADIOL 0.1 MG/24HR TD PTTW: 1.0000 | MEDICATED_PATCH | TRANSDERMAL | Status: DC

## 2011-05-26 NOTE — Telephone Encounter (Signed)
Please let Ms Zellars know that the twice weekly patch has been sent to her pharmacy.

## 2011-05-26 NOTE — Telephone Encounter (Signed)
Pt informed. Sara Patrick  

## 2011-05-26 NOTE — Telephone Encounter (Signed)
Addended byPerley Jain, TODD D on: 05/26/2011 02:34 PM   Modules accepted: Orders

## 2011-05-26 NOTE — Telephone Encounter (Signed)
Addended byPerley Jain, Kachina Niederer D on: 05/26/2011 12:56 PM   Modules accepted: Orders

## 2012-01-25 ENCOUNTER — Other Ambulatory Visit: Payer: Self-pay | Admitting: Family Medicine

## 2012-01-25 ENCOUNTER — Other Ambulatory Visit: Payer: Self-pay | Admitting: Radiation Oncology

## 2012-01-25 DIAGNOSIS — Z1231 Encounter for screening mammogram for malignant neoplasm of breast: Secondary | ICD-10-CM

## 2012-02-26 ENCOUNTER — Ambulatory Visit
Admission: RE | Admit: 2012-02-26 | Discharge: 2012-02-26 | Disposition: A | Discharge status: Home / Self Care | Payer: BC Managed Care – PPO | Source: Ambulatory Visit | Attending: Family Medicine | Admitting: Family Medicine

## 2012-02-26 DIAGNOSIS — Z1231 Encounter for screening mammogram for malignant neoplasm of breast: Secondary | ICD-10-CM

## 2012-02-29 ENCOUNTER — Encounter: Payer: Self-pay | Admitting: Family Medicine

## 2012-02-29 ENCOUNTER — Ambulatory Visit (INDEPENDENT_AMBULATORY_CARE_PROVIDER_SITE_OTHER): Payer: BC Managed Care – PPO | Admitting: Family Medicine

## 2012-02-29 VITALS — BP 134/85 | HR 80 | Temp 98.7°F | Ht 62.7 in | Wt 151.0 lb

## 2012-02-29 DIAGNOSIS — N959 Unspecified menopausal and perimenopausal disorder: Secondary | ICD-10-CM

## 2012-02-29 DIAGNOSIS — Z79899 Other long term (current) drug therapy: Secondary | ICD-10-CM

## 2012-02-29 DIAGNOSIS — Z13220 Encounter for screening for lipoid disorders: Secondary | ICD-10-CM

## 2012-02-29 DIAGNOSIS — J309 Allergic rhinitis, unspecified: Secondary | ICD-10-CM

## 2012-02-29 DIAGNOSIS — J45909 Unspecified asthma, uncomplicated: Secondary | ICD-10-CM

## 2012-02-29 DIAGNOSIS — K219 Gastro-esophageal reflux disease without esophagitis: Secondary | ICD-10-CM

## 2012-02-29 DIAGNOSIS — I8390 Asymptomatic varicose veins of unspecified lower extremity: Secondary | ICD-10-CM

## 2012-02-29 DIAGNOSIS — Z23 Encounter for immunization: Secondary | ICD-10-CM

## 2012-02-29 LAB — BASIC METABOLIC PANEL WITH GFR
BUN: 14 mg/dL (ref 6–23)
CO2: 28 meq/L (ref 19–32)
Calcium: 9.8 mg/dL (ref 8.4–10.5)
Chloride: 102 meq/L (ref 96–112)
Creat: 0.77 mg/dL (ref 0.50–1.10)
Glucose, Bld: 78 mg/dL (ref 70–99)
Potassium: 3.7 meq/L (ref 3.5–5.3)
Sodium: 138 meq/L (ref 135–145)

## 2012-02-29 LAB — LDL CHOLESTEROL, DIRECT: Direct LDL: 106 mg/dL — ABNORMAL HIGH

## 2012-02-29 NOTE — Patient Instructions (Signed)
Varicose Veins  Varicose veins are veins that have become enlarged and twisted.  CAUSES  This condition is the result of valves in the veins not working properly. Valves in the veins help return blood from the leg to the heart. If these valves are damaged, blood flows backwards and backs up into the veins in the leg near the skin. This causes the veins to become larger. People who are on their feet a lot, who are pregnant, or who are overweight are more likely to develop varicose veins.  SYMPTOMS    Bulging, twisted-appearing, bluish veins, most commonly found on the legs.   Leg pain or a feeling of heaviness. These symptoms may be worse at the end of the day.   Leg swelling.   Skin color changes.  DIAGNOSIS   Varicose veins can usually be diagnosed with an exam of your legs by your caregiver. He or she may recommend an ultrasound of your leg veins.  TREATMENT   Most varicose veins can be treated at home.However, other treatments are available for people who have persistent symptoms or who want to treat the cosmetic appearance of the varicose veins. These include:   Laser treatment of very small varicose veins.   Medicine that is shot (injected) into the vein. This medicine hardens the walls of the vein and closes off the vein. This treatment is called sclerotherapy. Afterwards, you may need to wear clothing or bandages that apply pressure.   Surgery.  HOME CARE INSTRUCTIONS    Do not stand or sit in one position for long periods of time. Do not sit with your legs crossed. Rest with your legs raised during the day.   Wear elastic stockings or support hose. Do not wear other tight, encircling garments around the legs, pelvis, or waist.   Walk as much as possible to increase blood flow.   Raise the foot of your bed at night with 2-inch blocks.   If you get a cut in the skin over the vein and the vein bleeds, lie down with your leg raised and press on it with a clean cloth until the bleeding stops. Then  place a bandage (dressing) on the cut. See your caregiver if it continues to bleed or needs stitches.  SEEK MEDICAL CARE IF:    The skin around your ankle starts to break down.   You have pain, redness, tenderness, or hard swelling developing in your leg over a vein.   You are uncomfortable due to leg pain.  Document Released: 03/18/2005 Document Revised: 05/28/2011 Document Reviewed: 08/04/2010  Exit[employer] Patient Information 2012 [employer].

## 2012-03-01 ENCOUNTER — Encounter: Payer: Self-pay | Admitting: Family Medicine

## 2012-03-01 DIAGNOSIS — I8390 Asymptomatic varicose veins of unspecified lower extremity: Secondary | ICD-10-CM

## 2012-03-01 HISTORY — DX: Asymptomatic varicose veins of unspecified lower extremity: I83.90

## 2012-03-01 NOTE — Assessment & Plan Note (Signed)
Adequate symptom control. Tolerating medications.  Continue current medications.  

## 2012-03-01 NOTE — Assessment & Plan Note (Signed)
New.  Asymptomatic. Reassurance.  Pt education.  Supportive (OTC) hosiery recommended.

## 2012-03-01 NOTE — Assessment & Plan Note (Signed)
Adequate symptom control control. Tolerating medications.   Continue current medications.

## 2012-03-01 NOTE — Progress Notes (Signed)
  Subjective:    Patient ID: Sara Patrick, female    DOB: 12/31/1970, 41 y.o.   MRN: [id number]  HPI  History of Present Illness: Hormonal Replacement Therpy No vasomotor symptoms No dysuria No chest pain, No SOB, No leg swelling.  Pt does not smoke.   Vivelle adhering without difficulty  Allergic Rhinitis Good response to Flonase uses every day. No sneezing, nasal drainage, coughing, watery eyes, nasal congestion.  Denies both epistaxis and nasal burning with use of Flonase.  .  Asthma  For two week period in June-July, using albuterol MDI daily for chest tightness.  Thinks exacerbation was due to mold that has been found at work place. Currently not needing albuterol.   No nocturnal symptoms, No wheezing, Able to do erobic exercise without experiencing shortness of breath.  GERD Has been taking omeprazole 20 mg daily with good symptom control. Has heartburn if skips a day of omeprazole.  No heartburn/indigestion,   She has not had to use any OTC antacids.  Denies melena and hematochezia.   Overweight Has gained about 11 pounds since last year with Diet and exercise.  Walking at least 30 minutes every day and frequently does Tai Bo. 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   Skin lesion Discolored raised area of left posterior calf, nonpainful, No itching.  No increased warmth. No redness.  No recalled trauma. Noted about 2-3 weeks ago. Has not tried any home therapy    Review of Systems      Objective:   Physical Exam  Constitutional: Vital signs are normal. She appears well-developed and well-nourished. No distress.  Neck: No mass and no thyromegaly present.  Cardiovascular: Normal rate, regular rhythm and normal heart sounds.   No murmur heard. Pulmonary/Chest: Effort normal and breath sounds normal.  Abdominal: Soft. Normal appearance and normal aorta. There is no hepatosplenomegaly. There is no tenderness.  Neurological: She is  alert.  Skin:             Assessment & Plan:

## 2012-04-05 ENCOUNTER — Other Ambulatory Visit: Payer: Self-pay | Admitting: Family Medicine

## 2012-05-31 ENCOUNTER — Telehealth: Payer: Self-pay | Admitting: Family Medicine

## 2012-05-31 NOTE — Telephone Encounter (Signed)
Sara Patrick have questions regarding her visit on 02/29/12.  Was suppose to have had a Complete physical that day,but was billed to insurance as an E&M visit.  Patient need to have this corrected due to insurance allowing one free preventive visit yearly.

## 2012-07-04 ENCOUNTER — Other Ambulatory Visit: Payer: Self-pay | Admitting: Family Medicine

## 2012-08-29 ENCOUNTER — Other Ambulatory Visit: Payer: Self-pay | Admitting: Family Medicine

## 2012-08-29 DIAGNOSIS — N959 Unspecified menopausal and perimenopausal disorder: Secondary | ICD-10-CM

## 2012-10-02 ENCOUNTER — Other Ambulatory Visit: Payer: Self-pay | Admitting: Family Medicine

## 2012-11-22 ENCOUNTER — Other Ambulatory Visit: Payer: Self-pay | Admitting: Family Medicine

## 2012-11-22 NOTE — Telephone Encounter (Signed)
Requested Prescriptions   Pending Prescriptions Disp Refills  . VIVELLE-DOT 0.1 MG/24HR [Pharmacy Med Name: VIVELLE-DOT 0.1MG  PATCH] 8 patch 0    Sig: APPLY 1 PATCH TO SKIN TWICE EACH WEEK   Please advise if more refills are allowed - no office visit since 2013.  Wyatt Haste, RN-BSN

## 2012-11-30 ENCOUNTER — Other Ambulatory Visit: Payer: Self-pay | Admitting: *Deleted

## 2012-11-30 NOTE — Telephone Encounter (Signed)
Requested Prescriptions   Pending Prescriptions Disp Refills  . estradiol (VIVELLE-DOT) 0.1 MG/24HR 8 patch 0   Wyatt Haste, RN-BSN

## 2012-12-16 MED ORDER — ESTRADIOL 0.1 MG/24HR TD PTTW: MEDICATED_PATCH | TRANSDERMAL | Status: DC

## 2013-02-08 ENCOUNTER — Other Ambulatory Visit: Payer: Self-pay

## 2013-02-08 DIAGNOSIS — Z1231 Encounter for screening mammogram for malignant neoplasm of breast: Secondary | ICD-10-CM

## 2013-02-26 ENCOUNTER — Other Ambulatory Visit: Payer: Self-pay | Admitting: Family Medicine

## 2013-03-06 ENCOUNTER — Encounter: Payer: Self-pay | Admitting: Family Medicine

## 2013-03-06 ENCOUNTER — Ambulatory Visit
Admission: RE | Admit: 2013-03-06 | Discharge: 2013-03-06 | Disposition: A | Discharge status: Home / Self Care | Payer: BC Managed Care – PPO | Source: Ambulatory Visit

## 2013-03-06 ENCOUNTER — Ambulatory Visit (INDEPENDENT_AMBULATORY_CARE_PROVIDER_SITE_OTHER): Payer: BC Managed Care – PPO | Admitting: Family Medicine

## 2013-03-06 VITALS — BP 122/82 | HR 60 | Temp 98.8°F | Ht 62.7 in | Wt 145.0 lb

## 2013-03-06 DIAGNOSIS — E663 Overweight: Secondary | ICD-10-CM

## 2013-03-06 DIAGNOSIS — J309 Allergic rhinitis, unspecified: Secondary | ICD-10-CM

## 2013-03-06 DIAGNOSIS — Z13220 Encounter for screening for lipoid disorders: Secondary | ICD-10-CM

## 2013-03-06 DIAGNOSIS — Z1231 Encounter for screening mammogram for malignant neoplasm of breast: Secondary | ICD-10-CM

## 2013-03-06 DIAGNOSIS — Z131 Encounter for screening for diabetes mellitus: Secondary | ICD-10-CM

## 2013-03-06 DIAGNOSIS — N959 Unspecified menopausal and perimenopausal disorder: Secondary | ICD-10-CM

## 2013-03-06 DIAGNOSIS — J45909 Unspecified asthma, uncomplicated: Secondary | ICD-10-CM

## 2013-03-06 DIAGNOSIS — Z23 Encounter for immunization: Secondary | ICD-10-CM

## 2013-03-06 DIAGNOSIS — K219 Gastro-esophageal reflux disease without esophagitis: Secondary | ICD-10-CM

## 2013-03-06 LAB — LIPID PANEL
LDL Cholesterol: 91 mg/dL (ref 0–99)
Total CHOL/HDL Ratio: 2.4 Ratio
Triglycerides: 68 mg/dL (ref <150)

## 2013-03-06 LAB — GLUCOSE, CAPILLARY: Glucose-Capillary: 84 mg/dL (ref 70–99)

## 2013-03-06 MED ORDER — ALBUTEROL SULFATE HFA 108 (90 BASE) MCG/ACT IN AERS: 2.0000 | INHALATION_SPRAY | RESPIRATORY_TRACT | Status: DC | PRN

## 2013-03-06 NOTE — Patient Instructions (Addendum)
Congratulation on the weight loss. Keep up you diet and exercise.  It is working for you!  Dr Gesselle Fitzsimons will call you if your tests are not good. Otherwise he will send you a letter.  If you sign up for MyChart online, you will be able to see your test results once Dr Urijah Arko has reviewed them.  If you do not hear from [place] with in 2 weeks please call our office

## 2013-03-07 ENCOUNTER — Encounter: Payer: Self-pay | Admitting: Family Medicine

## 2013-03-07 NOTE — Assessment & Plan Note (Signed)
Adequate symptom control. Tolerating medications.  Continue current medications.  

## 2013-03-07 NOTE — Assessment & Plan Note (Signed)
Improving with diet and exercise with a health coach via work

## 2013-03-07 NOTE — Assessment & Plan Note (Signed)
Adequate symptom control with just nasal saline

## 2013-03-07 NOTE — Progress Notes (Signed)
  Subjective:    Patient ID: Sara Patrick, female    DOB: February 26, 1971, 42 y.o.   MRN: [id number]  HPI  History of Present Illness: Hormonal Replacement Therpy No vasomotor symptoms No dysuria No chest pain, No SOB, No leg swelling.  Pt does not smoke.   Vivelle adhering without difficulty  Allergic Rhinitis Good control with just nasal saline.  Asthma  Has not had to use albuterol for months.  No nocturnal symptoms, No wheezing, Able to do erobic exercise without experiencing shortness of breath.  GERD Has been taking omeprazole 20 mg daily with good symptom control. Has heartburn if skips a day of omeprazole.  No heartburn/indigestion,   She has not had to use any OTC antacids.  Denies melena and hematochezia.   Overweight Lost weight with Diet (low Carb) and exercise.  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   Review of Systems Se HPI No unintentional weight loss No headache.  No nightsweats.        Objective:   Physical Exam  Constitutional: Vital signs are normal. She appears well-developed and well-nourished. No distress.  Neck: No mass and no thyromegaly present.  Cardiovascular: Normal rate, regular rhythm and normal heart sounds.   No murmur heard. Pulmonary/Chest: Effort normal and breath sounds normal.  Abdominal: Soft. Normal appearance and normal aorta. There is no hepatosplenomegaly. There is no tenderness.  Neurological: She is alert.  Skin: No rash         Assessment & Plan:

## 2013-03-07 NOTE — Assessment & Plan Note (Signed)
Adequate symptom control. Tolerating medications. Continue current medications.

## 2013-06-30 ENCOUNTER — Other Ambulatory Visit: Payer: Self-pay | Admitting: Family Medicine

## 2013-07-17 ENCOUNTER — Other Ambulatory Visit: Payer: Self-pay | Admitting: Family Medicine

## 2013-07-17 MED ORDER — OMEPRAZOLE 20 MG PO CPDR: 20.0000 mg | DELAYED_RELEASE_CAPSULE | Freq: Every day | ORAL | Status: DC | PRN

## 2013-08-04 ENCOUNTER — Other Ambulatory Visit: Payer: Self-pay | Admitting: Family Medicine

## 2014-02-20 DIAGNOSIS — H7290 Unspecified perforation of tympanic membrane, unspecified ear: Secondary | ICD-10-CM

## 2014-02-20 HISTORY — DX: Unspecified perforation of tympanic membrane, unspecified ear: H72.90

## 2014-03-28 ENCOUNTER — Other Ambulatory Visit: Payer: Self-pay

## 2014-03-28 DIAGNOSIS — Z1239 Encounter for other screening for malignant neoplasm of breast: Secondary | ICD-10-CM

## 2014-03-29 ENCOUNTER — Ambulatory Visit (INDEPENDENT_AMBULATORY_CARE_PROVIDER_SITE_OTHER): Payer: BC Managed Care – PPO | Admitting: Family Medicine

## 2014-03-29 ENCOUNTER — Encounter: Payer: Self-pay | Admitting: Family Medicine

## 2014-03-29 VITALS — BP 132/85 | HR 57 | Temp 97.9°F | Wt 145.0 lb

## 2014-03-29 DIAGNOSIS — K219 Gastro-esophageal reflux disease without esophagitis: Secondary | ICD-10-CM

## 2014-03-29 DIAGNOSIS — R739 Hyperglycemia, unspecified: Secondary | ICD-10-CM | POA: Diagnosis not present

## 2014-03-29 DIAGNOSIS — Z13220 Encounter for screening for lipoid disorders: Secondary | ICD-10-CM | POA: Diagnosis not present

## 2014-03-29 DIAGNOSIS — J302 Other seasonal allergic rhinitis: Secondary | ICD-10-CM

## 2014-03-29 DIAGNOSIS — N959 Unspecified menopausal and perimenopausal disorder: Secondary | ICD-10-CM

## 2014-03-29 DIAGNOSIS — J4520 Mild intermittent asthma, uncomplicated: Secondary | ICD-10-CM | POA: Diagnosis not present

## 2014-03-29 LAB — LIPID PANEL
CHOL/HDL RATIO: 2.4 ratio
Cholesterol: 145 mg/dL (ref 0–200)
HDL: 61 mg/dL (ref >39)
LDL CALC: 75 mg/dL (ref 0–99)
Triglycerides: 46 mg/dL (ref <150)
VLDL: 9 mg/dL (ref 0–40)

## 2014-03-29 LAB — POCT GLYCOSYLATED HEMOGLOBIN (HGB A1C): HEMOGLOBIN A1C: 5.4

## 2014-03-29 MED ORDER — FLUTICASONE PROPIONATE 50 MCG/ACT NA SUSP: 2.0000 | Freq: Every day | NASAL | Status: DC

## 2014-03-29 MED ORDER — ESTRADIOL 0.1 MG/24HR TD PTTW: 1.0000 | MEDICATED_PATCH | TRANSDERMAL | Status: DC

## 2014-03-29 MED ORDER — OMEPRAZOLE 20 MG PO CPDR: 20.0000 mg | DELAYED_RELEASE_CAPSULE | Freq: Every day | ORAL | Status: DC | PRN

## 2014-03-29 MED ORDER — ALBUTEROL SULFATE HFA 108 (90 BASE) MCG/ACT IN AERS: 2.0000 | INHALATION_SPRAY | RESPIRATORY_TRACT | Status: DC | PRN

## 2014-03-29 NOTE — Patient Instructions (Addendum)
Recommendation of Vitamin D for bone health is 600 Internation Units daily.  Most Multivitamin have 40 IU per pill.  Add a Calcium 600 mg plus a Vitamin D 400 units daily to your diet to promote bone health.

## 2014-03-30 ENCOUNTER — Encounter: Payer: Self-pay | Admitting: Family Medicine

## 2014-03-30 NOTE — Assessment & Plan Note (Signed)
Chronic stable. Adequate symptom control with Flonase daily Continue current therapy.

## 2014-03-30 NOTE — Assessment & Plan Note (Addendum)
Chronic.  On ERT since 2007. Ongoing intermittent vasomotor flushes, non-disabling.  Pt not interested in treatment at this time.  No evidence of intolerance to Vivelle patches. Continue current therapy.

## 2014-03-30 NOTE — Assessment & Plan Note (Signed)
Adequate symptom control. Tolerating rare use of albuterol MDI medication. Continue current medication regiment.

## 2014-03-30 NOTE — Assessment & Plan Note (Signed)
Adequate symptom control. Tolerating omeprazole medication. Continue current medication regiment. Will discuss trying H2B on next office visit.  Recommended start of Calcium 600 mg + Vitamin D 400 units tab daily in addition to her MVI to promote bone health

## 2014-03-30 NOTE — Progress Notes (Signed)
   Subjective:    Patient ID: Sara Patrick, female    DOB: 07-10-1970, 43 y.o.   MRN: [id number]  HPI  Postmenopausal, surgical - S/P TAH/BSO in 2007 for endometriosis and adenomyosis.   - Patient has been on ERT (Vivelle patch) since the TAH/BSO - Vasomotor symptoms of hotflashes 2 to 3 times a week, brief, non-disabling.  Pt able to cope with experience.  Does not desire any treatment at this time.  - Denies dysuria, chest pain, shortness of breath, leg pain or swelling. - She does not smoke  Allergic Rhinitis - Chronic condition, seasonal - Uses Flonase with good response and tolerance - Not interfering with activities and sleep - No sneezing, epistaxis, nasal congestion, sinus pain.    Asthma, intermittent, vernal - Chronic condition - Rare use of albuterol, last used one time in Spring season - Not interfering with activities or sleep - No cough, wheezing, DOE with exercise, no nocturnal cough or awakening with dyspnea.   GERD - Chronic Condition - Daily sue of omeprazole 20 mg.   - Develops indigestion and heartburn when she forgets to get medication refilled.  - No melena, no weight loss, no bone fractues, no epigastric pain.  - Not interfering with activities or sleep  Health Maintenance - Patient receiving annual mammogram through work site.  Will have her screening mammogram this month.  Last years screening mammogram reported as not showing evidence of malignancy.      I have reviewed the patient's medical history/Surgical/medications in detail; additions and changes to the history as noted in the electronic medical record.  Review of Systems Review of Systems -12 organ systems Negative except trouble hearling in left ear after traumatic injury to TM: associated ringing in left ear.    Objective:   Physical Exam        Assessment & Plan:

## 2014-04-12 ENCOUNTER — Other Ambulatory Visit: Payer: Self-pay

## 2014-04-12 DIAGNOSIS — Z1231 Encounter for screening mammogram for malignant neoplasm of breast: Secondary | ICD-10-CM

## 2014-04-20 ENCOUNTER — Ambulatory Visit
Admission: RE | Admit: 2014-04-20 | Discharge: 2014-04-20 | Disposition: A | Discharge status: Home / Self Care | Payer: BC Managed Care – PPO | Source: Ambulatory Visit

## 2014-04-20 DIAGNOSIS — Z1231 Encounter for screening mammogram for malignant neoplasm of breast: Secondary | ICD-10-CM

## 2014-04-23 ENCOUNTER — Encounter: Payer: Self-pay | Admitting: Family Medicine

## 2015-04-02 ENCOUNTER — Other Ambulatory Visit: Payer: Self-pay | Admitting: Family Medicine

## 2015-05-09 ENCOUNTER — Ambulatory Visit (INDEPENDENT_AMBULATORY_CARE_PROVIDER_SITE_OTHER): Payer: Managed Care, Other (non HMO) | Admitting: Family Medicine

## 2015-05-09 VITALS — BP 132/82 | HR 69 | Temp 98.1°F | Ht 61.0 in | Wt 157.5 lb

## 2015-05-09 DIAGNOSIS — Z23 Encounter for immunization: Secondary | ICD-10-CM

## 2015-05-09 DIAGNOSIS — Z1159 Encounter for screening for other viral diseases: Secondary | ICD-10-CM

## 2015-05-09 DIAGNOSIS — Z7289 Other problems related to lifestyle: Secondary | ICD-10-CM

## 2015-05-09 DIAGNOSIS — M25862 Other specified joint disorders, left knee: Secondary | ICD-10-CM

## 2015-05-09 DIAGNOSIS — Z79899 Other long term (current) drug therapy: Secondary | ICD-10-CM

## 2015-05-09 DIAGNOSIS — J4520 Mild intermittent asthma, uncomplicated: Secondary | ICD-10-CM

## 2015-05-09 DIAGNOSIS — E663 Overweight: Secondary | ICD-10-CM | POA: Diagnosis not present

## 2015-05-09 DIAGNOSIS — N959 Unspecified menopausal and perimenopausal disorder: Secondary | ICD-10-CM

## 2015-05-09 DIAGNOSIS — Z609 Problem related to social environment, unspecified: Secondary | ICD-10-CM

## 2015-05-09 DIAGNOSIS — K219 Gastro-esophageal reflux disease without esophagitis: Secondary | ICD-10-CM

## 2015-05-09 LAB — BASIC METABOLIC PANEL
BUN: 12 mg/dL (ref 7–25)
CHLORIDE: 104 mmol/L (ref 98–110)
CO2: 25 mmol/L (ref 20–31)
CREATININE: 0.66 mg/dL (ref 0.50–1.10)
Calcium: 9.1 mg/dL (ref 8.6–10.2)
GLUCOSE: 92 mg/dL (ref 65–99)
Potassium: 3.9 mmol/L (ref 3.5–5.3)
Sodium: 139 mmol/L (ref 135–146)

## 2015-05-09 LAB — LIPID PANEL
CHOL/HDL RATIO: 2.4 ratio (ref <=5.0)
CHOLESTEROL: 191 mg/dL (ref 125–200)
HDL: 78 mg/dL (ref >=46)
LDL Cholesterol: 102 mg/dL (ref <130)
Triglycerides: 56 mg/dL (ref <150)
VLDL: 11 mg/dL (ref <30)

## 2015-05-09 LAB — HEPATITIS C ANTIBODY: HCV AB: NEGATIVE

## 2015-05-09 LAB — VITAMIN B12: Vitamin B-12: 593 pg/mL (ref 211–911)

## 2015-05-09 NOTE — Patient Instructions (Signed)
I suspect that the stress of work schedule and load has contributed significantly to your weight gain. I like your idea of distributing your food intake over the day.  The general rule from our nutritionist is to not go more than 4 hours without eating something.  In the long run, you will actually eat less if you distribute your intake.   Try to perform the quadricep muscle exercises three times a day if possible.  Try the Neopryn sleeve with the hole cut out for the knee cap.  Wear it thought out the day while you are standing or working.

## 2015-05-10 ENCOUNTER — Encounter: Payer: Self-pay | Admitting: Family Medicine

## 2015-05-10 DIAGNOSIS — M25862 Other specified joint disorders, left knee: Secondary | ICD-10-CM

## 2015-05-10 DIAGNOSIS — E669 Obesity, unspecified: Secondary | ICD-10-CM | POA: Insufficient documentation

## 2015-05-10 HISTORY — DX: Other specified joint disorders, left knee: M25.862

## 2015-05-10 LAB — HIV ANTIBODY (ROUTINE TESTING W REFLEX): HIV 1&2 Ab, 4th Generation: NONREACTIVE

## 2015-05-10 NOTE — Progress Notes (Signed)
Patient ID: Sara Patrick, female   DOB: September 20, 1970, 44 y.o.   MRN: [id number]   Subjective:    Patient ID: Sara CoombeSonia L Patrick, female    DOB: September 20, 1970, 44 y.o.   MRN: [id number]  Knee Pain   Onset: 2 weeks ago Location: around left knee cap Quality: ache with intermittent sharp pain Severity: mild to moderate Function: interfereing with home video aerobic exercise Pattern: intermittent Course: improving slightly Radiation: no Relief: lying down,  Worse with standing up first thing in morning or after prolonged sitting.   She is able to bear weight on leg. No weakness or tingling in leg. No loss of range of motion.  No locking.  Precipitant: no known cause, Does jump rope and perform home aerobics Associated Symptoms: no swellling or redness to knee or any other joints.  Trauma (Acute or Chronic): no Prior Diagnostic Testing or Treatments: none Relevant PMH/PSH: none   Postmenopausal, surgical - S/P TAH/BSO in 2007 for endometriosis and adenomyosis.   - Patient has been on ERT (Vivelle patch) since the TAH/BSO - Vasomotor symptoms of hotflashescouple times a week, brief, non-disabling.   - Denies dysuria, chest pain, shortness of breath, leg pain or swelling. - She does not smoke  Allergic Rhinitis - Chronic condition, seasonal - Uses Flonase with good response and tolerance - Not interfering with activities and sleep - No sneezing, epistaxis, nasal congestion, sinus pain.    Asthma, intermittent, vernal - Chronic condition - Rare use of albuterol, usually with URIs.  Used twice this year - Not interfering with activities or sleep - No cough, wheezing, DOE with exercise, no nocturnal cough or awakening with dyspnea.   GERD - Chronic Condition - Daily sue of omeprazole 20 mg.   - Develops indigestion and heartburn when she forgets to get medication refilled.  - No melena, no weight loss, no bone fractues, no epigastric pain.  - Not interfering with activities or  sleep  Health Maintenance SH: no smoking ROS: no melena, hematochezia, no hearing loss, no change in vision. No skin rashes or lesions   I have reviewed the patient's medical history/Surgical/medications in detail; additions and changes to the history as noted in the electronic medical record.  Review of Systems Review of Systems -normal except  Negative except trouble hearling in left ear after traumatic injury to TM: associated ringing in left ear.    Objective:   Physical Exam VS reviewed GEN: Alert, Cooperative, Groomed, NAD HEENT: PERRL; EAC bilaterally not occluded, TM's translucent with normal LM, (+) LR;                No cervical LAN, No thyromegaly, No palpable masses COR: RRR, No M/G/R, No JVD, Normal PMI size and location LUNGS: BCTA, No Acc mm use, speaking in full sentences ABDOMEN: (+)BS, soft, NT, ND, No HSM, No palpable masses EXT: No peripheral leg edema. Feet without deformity or lesions. Palpable bilateral pedal pulses.  MSK: LEFT KNEE: Inspection: normal contour and shape of left knee, no edema, no erythema, poorly prominent VMO distal muscle contous.   Palpation: Tender around media and upper pole of patella, (+) patella compression test, no tenderness along tibial plateau, no tenderness of quadricep patella insertion, no tenderness over patella ligament,   Intact lateral and medial collateral ligament, neg Ant Drawer test.  SKIN: No lesion nor rashes of face/trunk/extremities Gait: Normal speed, No significant path deviation, Step through +,  Psych: Normal affect/thought/speech/language       Assessment & Plan:

## 2015-05-10 NOTE — Assessment & Plan Note (Signed)
Established problem Controlled Will continue daily omeprazole as withdrawal results in rebound worsening of GER symptoms. Checking Vitamin B12 to look for medication induced malabsorption.

## 2015-05-10 NOTE — Assessment & Plan Note (Signed)
Established problem Controlled Continue Flonase for perennial allergic rhinitis

## 2015-05-10 NOTE — Assessment & Plan Note (Signed)
New problem No further workup planned Recommendation for VMO strengthening exercises and supportive neopryn knee sleeve with patella opening to wear during day until discomfort resolves.

## 2015-05-10 NOTE — Assessment & Plan Note (Addendum)
New problem No further workup planned. Patient has good understanding of a healthy diet and role of exercise in healthy lifestyle. Stress at work from high work load has lead to less healthy habits as she had maintained before. She recognizes role of work stress on recent weight gain and plans to address it.  She declined any health coach counseling

## 2015-05-10 NOTE — Assessment & Plan Note (Signed)
Established problem Controlled Continue on Vivelle.  No evidence of complications from ERT.

## 2015-05-10 NOTE — Assessment & Plan Note (Signed)
Adequate symptom control. Tolerating prn albuterol use which she uses only couple times a year with URIs. Continue albuterol prn mdi

## 2015-05-14 ENCOUNTER — Encounter: Payer: Self-pay | Admitting: Family Medicine

## 2015-05-20 ENCOUNTER — Other Ambulatory Visit: Payer: Self-pay

## 2015-05-20 DIAGNOSIS — Z1231 Encounter for screening mammogram for malignant neoplasm of breast: Secondary | ICD-10-CM

## 2015-06-20 ENCOUNTER — Ambulatory Visit
Admission: RE | Admit: 2015-06-20 | Discharge: 2015-06-20 | Disposition: A | Discharge status: Home / Self Care | Payer: Managed Care, Other (non HMO) | Source: Ambulatory Visit

## 2015-06-20 DIAGNOSIS — Z1231 Encounter for screening mammogram for malignant neoplasm of breast: Secondary | ICD-10-CM

## 2015-10-02 ENCOUNTER — Other Ambulatory Visit: Payer: Self-pay | Admitting: Family Medicine

## 2015-12-30 ENCOUNTER — Other Ambulatory Visit: Payer: Self-pay | Admitting: Family Medicine

## 2016-04-02 ENCOUNTER — Other Ambulatory Visit: Payer: Self-pay | Admitting: Family Medicine

## 2016-04-29 ENCOUNTER — Other Ambulatory Visit: Payer: Self-pay | Admitting: Family Medicine

## 2016-05-21 ENCOUNTER — Encounter: Payer: Self-pay | Admitting: Family Medicine

## 2016-05-21 ENCOUNTER — Ambulatory Visit (INDEPENDENT_AMBULATORY_CARE_PROVIDER_SITE_OTHER): Payer: Managed Care, Other (non HMO) | Admitting: Family Medicine

## 2016-05-21 DIAGNOSIS — J302 Other seasonal allergic rhinitis: Secondary | ICD-10-CM

## 2016-05-21 DIAGNOSIS — Z6830 Body mass index (BMI) 30.0-30.9, adult: Secondary | ICD-10-CM

## 2016-05-21 DIAGNOSIS — N959 Unspecified menopausal and perimenopausal disorder: Secondary | ICD-10-CM

## 2016-05-21 DIAGNOSIS — J019 Acute sinusitis, unspecified: Secondary | ICD-10-CM

## 2016-05-21 DIAGNOSIS — M25862 Other specified joint disorders, left knee: Secondary | ICD-10-CM

## 2016-05-21 DIAGNOSIS — J4520 Mild intermittent asthma, uncomplicated: Secondary | ICD-10-CM | POA: Diagnosis not present

## 2016-05-21 DIAGNOSIS — E669 Obesity, unspecified: Secondary | ICD-10-CM

## 2016-05-21 DIAGNOSIS — Z23 Encounter for immunization: Secondary | ICD-10-CM | POA: Diagnosis not present

## 2016-05-21 DIAGNOSIS — J3089 Other allergic rhinitis: Secondary | ICD-10-CM

## 2016-05-21 DIAGNOSIS — K219 Gastro-esophageal reflux disease without esophagitis: Secondary | ICD-10-CM

## 2016-05-21 MED ORDER — FLUTICASONE PROPIONATE 50 MCG/ACT NA SUSP: NASAL | 99 refills | Status: DC

## 2016-05-21 MED ORDER — ESTRADIOL 0.1 MG/24HR TD PTTW: MEDICATED_PATCH | TRANSDERMAL | 99 refills | Status: DC

## 2016-05-21 MED ORDER — OMEPRAZOLE 20 MG PO CPDR: 20.0000 mg | DELAYED_RELEASE_CAPSULE | Freq: Every day | ORAL | 99 refills | Status: DC

## 2016-05-21 MED ORDER — IPRATROPIUM BROMIDE 0.06 % NA SOLN: 2.0000 | Freq: Four times a day (QID) | NASAL | 0 refills | Status: DC

## 2016-05-21 MED ORDER — IBUPROFEN 800 MG PO TABS: 800.0000 mg | ORAL_TABLET | Freq: Three times a day (TID) | ORAL | 0 refills | Status: DC | PRN

## 2016-05-21 MED ORDER — OXYMETAZOLINE HCL 0.05 % NA SOLN: 1.0000 | Freq: Two times a day (BID) | NASAL | 0 refills | Status: DC

## 2016-05-21 MED ORDER — ALBUTEROL SULFATE HFA 108 (90 BASE) MCG/ACT IN AERS: 2.0000 | INHALATION_SPRAY | RESPIRATORY_TRACT | 99 refills | Status: DC | PRN

## 2016-05-21 NOTE — Patient Instructions (Addendum)
I believe you have a viral upper respiratory infection with some involvement of your right maxillary sinus. Antibiotics will not shorten the course of the illness which is usually about one week.  If the sinusitis continues beyond 7 days, contact Dr Avonlea Sima.  He may want to prescribe antibiotics at that time.   Treatment for this viral infection Ipratropium nasal spray until symptoms of infection stop. Atrovent (metalozone) nasal spray for 4 days Ibuprofen 800 mg every 8 hours as needed for sore throat and aches.  Take with food.      Your BMI is 30% which puts you into the Obese category. If you are interesting in learning how to eat healthier, call the [facility] Nutritionist, Dr Wyona AlmasJeannie Sykes, to arrange a possible visit with her.  Her office phone number is [phone].  She sees patients at the [facility].       Sinusitis, Adult Sinusitis is soreness and inflammation of your sinuses. Sinuses are hollow spaces in the bones around your face. Your sinuses are located:  Around your eyes.  In the middle of your forehead.  Behind your nose.  In your cheekbones. Your sinuses and nasal passages are lined with a stringy fluid (mucus). Mucus normally drains out of your sinuses. When your nasal tissues become inflamed or swollen, the mucus can become trapped or blocked so air cannot flow through your sinuses. This allows bacteria, viruses, and funguses to grow, which leads to infection. Sinusitis can develop quickly and last for 7?10 days (acute) or for more than 12 weeks (chronic). Sinusitis often develops after a cold. What are the causes? This condition is caused by anything that creates swelling in the sinuses or stops mucus from draining, including:  Allergies.  Asthma.  Bacterial or viral infection.  Abnormally shaped bones between the nasal passages.  Nasal growths that contain mucus (nasal polyps).  Narrow sinus  openings.  Pollutants, such as chemicals or irritants in the air.  A foreign object stuck in the nose.  A fungal infection. This is rare. What increases the risk? The following factors may make you more likely to develop this condition:  Having allergies or asthma.  Having had a recent cold or respiratory tract infection.  Having structural deformities or blockages in your nose or sinuses.  Having a weak immune system.  Doing a lot of swimming or diving.  Overusing nasal sprays.  Smoking. What are the signs or symptoms? The main symptoms of this condition are pain and a feeling of pressure around the affected sinuses. Other symptoms include:  Upper toothache.  Earache.  Headache.  Bad breath.  Decreased sense of smell and taste.  A cough that may get worse at night.  Fatigue.  Fever.  Thick drainage from your nose. The drainage is often green and it may contain pus (purulent).  Stuffy nose or congestion.  Postnasal drip. This is when extra mucus collects in the throat or back of the nose.  Swelling and warmth over the affected sinuses.  Sore throat.  Sensitivity to light. How is this diagnosed? This condition is diagnosed based on symptoms, a medical history, and a physical exam. To find out if your condition is acute or chronic, your health care provider may:  Look in your nose for signs of nasal polyps.  Tap over the affected sinus to check for signs of infection.  View the inside of your sinuses using an imaging device that has a light attached (endoscope). If your  health care provider suspects that you have chronic sinusitis, you may also:  Be tested for allergies.  Have a sample of mucus taken from your nose (nasal culture) and checked for bacteria.  Have a mucus sample examined to see if your sinusitis is related to an allergy. If your sinusitis does not respond to treatment and it lasts longer than 8 weeks, you may have an MRI or CT scan to  check your sinuses. These scans also help to determine how severe your infection is. In rare cases, a bone biopsy may be done to rule out more serious types of fungal sinus disease. How is this treated? Treatment for sinusitis depends on the cause and whether your condition is chronic or acute. If a virus is causing your sinusitis, your symptoms will go away on their own within 10 days. You may be given medicines to relieve your symptoms, including:  Topical nasal decongestants. They shrink swollen nasal passages and let mucus drain from your sinuses.  Antihistamines. These drugs block inflammation that is triggered by allergies. This can help to ease swelling in your nose and sinuses.  Topical nasal corticosteroids. These are nasal sprays that ease inflammation and swelling in your nose and sinuses.  Nasal saline washes. These rinses can help to get rid of thick mucus in your nose. If your condition is caused by bacteria, you will be given an antibiotic medicine. If your condition is caused by a fungus, you will be given an antifungal medicine. Surgery may be needed to correct underlying conditions, such as narrow nasal passages. Surgery may also be needed to remove polyps. Follow these instructions at home: Medicines  Take, use, or apply over-the-counter and prescription medicines only as told by your health care provider. These may include nasal sprays.  If you were prescribed an antibiotic medicine, take it as told by your health care provider. Do not stop taking the antibiotic even if you start to feel better. Hydrate and Humidify  Drink enough water to keep your urine clear or pale yellow. Staying hydrated will help to thin your mucus.  Use a cool mist humidifier to keep the humidity level in your home above 50%.  Inhale steam for 10-15 minutes, 3-4 times a day or as told by your health care provider. You can do this in the bathroom while a hot shower is running.  Limit your  exposure to cool or dry air. Rest  Rest as much as possible.  Sleep with your head raised (elevated).  Make sure to get enough sleep each night. General instructions  Apply a warm, moist washcloth to your face 3-4 times a day or as told by your health care provider. This will help with discomfort.  Wash your hands often with soap and water to reduce your exposure to viruses and other germs. If soap and water are not available, use hand sanitizer.  Do not smoke. Avoid being around people who are smoking (secondhand smoke).  Keep all follow-up visits as told by your health care provider. This is important. Contact a health care provider if:  You have a fever.  Your symptoms get worse.  Your symptoms do not improve within 10 days. Get help right away if:  You have a severe headache.  You have persistent vomiting.  You have pain or swelling around your face or eyes.  You have vision problems.  You develop confusion.  Your neck is stiff.  You have trouble breathing. This information is not intended to replace  advice given to you by your health care provider. Make sure you discuss any questions you have with your health care provider. Document Released: 06/08/2005 Document Revised: 02/02/2016 Document Reviewed: 04/03/2015 Elsevier Interactive Patient Education  2017 [employer]

## 2016-05-22 ENCOUNTER — Encounter: Payer: Self-pay | Admitting: Family Medicine

## 2016-05-22 DIAGNOSIS — J019 Acute sinusitis, unspecified: Secondary | ICD-10-CM | POA: Insufficient documentation

## 2016-05-22 HISTORY — DX: Acute sinusitis, unspecified: J01.90

## 2016-05-22 NOTE — Assessment & Plan Note (Signed)
Established problem Controlled Will continue daily omeprazole as withdrawal results in return of GER symptoms soon after stopping daily PPI. Normal B12 level in recent past.

## 2016-05-22 NOTE — Assessment & Plan Note (Signed)
Improved with exercise. 

## 2016-05-22 NOTE — Assessment & Plan Note (Signed)
Established problem Controlled Continue Flonase for perennial allergic rhinitis 

## 2016-05-22 NOTE — Assessment & Plan Note (Signed)
Adequate symptom control. Tolerating prn albuterol use which she uses only couple times a year with URIs. Continue albuterol prn mdi  

## 2016-05-22 NOTE — Assessment & Plan Note (Signed)
New diagnosis Pt previously overweight  Takes formal exercise most days of the week. Unable to identify were she may be taking in excess calories. She is interested in consulting with Dr Gerilyn PilgrimSykes.  Contact info given

## 2016-05-22 NOTE — Assessment & Plan Note (Signed)
New problem. 3 days of symptoms Hx of  Start Atrovent Nasal Spray 0.06% Start Afrin  NS for 4 days Ibuprofen 800 mg TID prn facial pain or sore throat. Take with food If not improved by 12/4 pt to call Dr Kamisha Ell. Will consider use of Antibiotic at that point.

## 2016-05-22 NOTE — Progress Notes (Signed)
Patient ID: Sara Patrick, female   DOB: 1970/07/04, 45 y.o.   MRN: [id number]   Subjective:    Patient ID: Sara CoombeSonia L Patrick, female    DOB: 1970/07/04, 45 y.o.   MRN: [id number] Sara CoombeSonia L Dorminey is alone Sources of clinical information for visit is/are patient and past medical records. Nursing assessment for this office visit was reviewed with the patient for accuracy and revision.   Knee Pain    resolved with exercise with stationary bike   Postmenopausal, surgical - S/P TAH/BSO in 2007 for endometriosis and adenomyosis.   - Patient has been on ERT (Vivelle patch) since the TAH/BSO - Vasomotor symptoms of hotflashescouple times a week, brief, non-disabling.   - Denies dysuria, chest pain, shortness of breath, leg pain or swelling. - She does not smoke  Allergic Rhinitis - Chronic condition, seasonal - Uses Flonase with good response and tolerance - Not interfering with activities and sleep - No sneezing, epistaxis, nasal congestion, sinus pain.    Asthma, intermittent, vernal - Chronic condition - Rare use of albuterol, usually with URIs.  Used twice this year - Not interfering with activities or sleep - No cough, wheezing, DOE with exercise, no nocturnal cough or awakening with dyspnea.   GERD - Chronic Condition - Daily sue of omeprazole 20 mg.   - Develops indigestion and heartburn when she forgets to get medication refilled.  - No melena, no weight loss, no bone fractues, no epigastric pain.  - Not interfering with activities or sleep   UPPER RESPIRATORY INFECTION  Onset: 2 days ago  Course: stable   Better with: nothing Meds tried: nothing Sick contacts: at work  Nasal discharge (color,laterality): clear drainagfe right nostril  Sinusitis Risk Factors Fever: no   Headache/face pain: yes  Double sickening: no  Tooth pain: no  PMH: history of acute sinusitis events in past previously treated at urgent cares with antibiotics Allergy Risk Factors: Sneezing: no  Itchy  scratchy throat: yes  Seasonal sx: no   Flu Risk Factors Headache: yes, forehead  Muscle aches: no  Severe fatigue: no    Red Flags  Stiff neck: no  Dyspnea: no  Rash: no  Swallowing difficulty: no   SH: no smoking, 1 to 2 standard drinks of alcohol a day, never drank more than 6 drinks on occasion,exercises regularly 6 days a week.  ROS: no melena, hematochezia, no hearing loss, no change in vision. No skin rashes or lesions   I have reviewed the patient's medical history/Surgical/medications in detail; additions and changes to the history as noted in the electronic medical record.  Review of Systems Review of Systems -10 system review normal except  Cough and scratchy throat and itching ears    Objective:   Physical Exam VS reviewed GEN: Alert, Cooperative, Groomed, NAD HEENT: PERRL; EAC bilaterally not occluded, clear rhinorrhea right nasal vestibule, no conjunctival injection, No cx lan, Oropharynx with cobblestoning posterior, tender over right maxilla  COR: RRR, No M/G/R, No JVD, Normal PMI size and location LUNGS: BCTA, No Acc mm use, speaking in full sentences ABDOMEN: (+)BS, soft, NT, ND, No HSM, No palpable masses EXT: No peripheral leg edema. Feet without deformity or lesions. Palpable bilateral pedal pulses.  MSK: LEFT KNEE: Inspection: normal contour and shape of left knee, no edema, no erythema, poorly prominent  SKIN: No lesion nor rashes of face/trunk/extremities Gait: Normal speed, No significant path deviation, Step through +,  Psych: Normal affect/thought/speech/language    Assessment & Plan:

## 2016-05-22 NOTE — Assessment & Plan Note (Signed)
Established problem Controlled Continue on Vivelle.  No evidence of complications from ERT.  

## 2016-07-02 ENCOUNTER — Other Ambulatory Visit: Payer: Self-pay | Admitting: Family Medicine

## 2016-07-02 DIAGNOSIS — Z1231 Encounter for screening mammogram for malignant neoplasm of breast: Secondary | ICD-10-CM

## 2016-07-24 ENCOUNTER — Ambulatory Visit
Admission: RE | Admit: 2016-07-24 | Discharge: 2016-07-24 | Disposition: A | Discharge status: Home / Self Care | Payer: Managed Care, Other (non HMO) | Source: Ambulatory Visit | Attending: Family Medicine | Admitting: Family Medicine

## 2016-07-24 DIAGNOSIS — Z1231 Encounter for screening mammogram for malignant neoplasm of breast: Secondary | ICD-10-CM

## 2016-07-27 ENCOUNTER — Other Ambulatory Visit: Payer: Self-pay | Admitting: Family Medicine

## 2016-07-27 DIAGNOSIS — R928 Other abnormal and inconclusive findings on diagnostic imaging of breast: Secondary | ICD-10-CM

## 2016-07-31 ENCOUNTER — Ambulatory Visit
Admission: RE | Admit: 2016-07-31 | Discharge: 2016-07-31 | Disposition: A | Discharge status: Home / Self Care | Payer: Managed Care, Other (non HMO) | Source: Ambulatory Visit | Attending: Family Medicine | Admitting: Family Medicine

## 2016-07-31 DIAGNOSIS — R928 Other abnormal and inconclusive findings on diagnostic imaging of breast: Secondary | ICD-10-CM

## 2017-01-11 ENCOUNTER — Other Ambulatory Visit: Payer: Self-pay | Admitting: Family Medicine

## 2017-01-11 DIAGNOSIS — N63 Unspecified lump in unspecified breast: Secondary | ICD-10-CM

## 2017-01-29 ENCOUNTER — Ambulatory Visit: Payer: Managed Care, Other (non HMO)

## 2017-01-29 ENCOUNTER — Ambulatory Visit
Admission: RE | Admit: 2017-01-29 | Discharge: 2017-01-29 | Disposition: A | Discharge status: Home / Self Care | Payer: Managed Care, Other (non HMO) | Source: Ambulatory Visit | Attending: Family Medicine | Admitting: Family Medicine

## 2017-01-29 DIAGNOSIS — N6320 Unspecified lump in the left breast, unspecified quadrant: Secondary | ICD-10-CM

## 2017-01-29 DIAGNOSIS — N63 Unspecified lump in unspecified breast: Secondary | ICD-10-CM

## 2017-02-01 DIAGNOSIS — N6320 Unspecified lump in the left breast, unspecified quadrant: Secondary | ICD-10-CM

## 2017-02-01 HISTORY — DX: Unspecified lump in the left breast, unspecified quadrant: N63.20

## 2017-06-06 ENCOUNTER — Other Ambulatory Visit: Payer: Self-pay | Admitting: Family Medicine

## 2017-06-25 ENCOUNTER — Other Ambulatory Visit: Payer: Self-pay | Admitting: Family Medicine

## 2017-07-19 ENCOUNTER — Other Ambulatory Visit: Payer: Self-pay | Admitting: Family Medicine

## 2017-07-19 DIAGNOSIS — N6320 Unspecified lump in the left breast, unspecified quadrant: Secondary | ICD-10-CM

## 2017-07-22 ENCOUNTER — Encounter: Payer: Self-pay | Admitting: Family Medicine

## 2017-07-22 ENCOUNTER — Ambulatory Visit (INDEPENDENT_AMBULATORY_CARE_PROVIDER_SITE_OTHER): Payer: Managed Care, Other (non HMO) | Admitting: Family Medicine

## 2017-07-22 ENCOUNTER — Other Ambulatory Visit: Payer: Self-pay

## 2017-07-22 VITALS — BP 128/82 | HR 61 | Temp 97.7°F | Ht 61.0 in | Wt 165.0 lb

## 2017-07-22 DIAGNOSIS — Z23 Encounter for immunization: Secondary | ICD-10-CM

## 2017-07-22 DIAGNOSIS — H18419 Arcus senilis, unspecified eye: Secondary | ICD-10-CM

## 2017-07-22 DIAGNOSIS — N6320 Unspecified lump in the left breast, unspecified quadrant: Secondary | ICD-10-CM | POA: Diagnosis not present

## 2017-07-22 DIAGNOSIS — J4520 Mild intermittent asthma, uncomplicated: Secondary | ICD-10-CM

## 2017-07-22 DIAGNOSIS — S90222A Contusion of left lesser toe(s) with damage to nail, initial encounter: Secondary | ICD-10-CM

## 2017-07-22 DIAGNOSIS — N959 Unspecified menopausal and perimenopausal disorder: Secondary | ICD-10-CM | POA: Diagnosis not present

## 2017-07-22 DIAGNOSIS — J3089 Other allergic rhinitis: Secondary | ICD-10-CM | POA: Diagnosis not present

## 2017-07-22 DIAGNOSIS — Z13220 Encounter for screening for lipoid disorders: Secondary | ICD-10-CM

## 2017-07-22 DIAGNOSIS — Z79899 Other long term (current) drug therapy: Secondary | ICD-10-CM

## 2017-07-22 DIAGNOSIS — K219 Gastro-esophageal reflux disease without esophagitis: Secondary | ICD-10-CM | POA: Diagnosis not present

## 2017-07-22 DIAGNOSIS — J309 Allergic rhinitis, unspecified: Secondary | ICD-10-CM

## 2017-07-22 DIAGNOSIS — J302 Other seasonal allergic rhinitis: Secondary | ICD-10-CM

## 2017-07-22 DIAGNOSIS — H1010 Acute atopic conjunctivitis, unspecified eye: Secondary | ICD-10-CM

## 2017-07-22 MED ORDER — FLUTICASONE PROPIONATE 50 MCG/ACT NA SUSP: 2.0000 | Freq: Every day | NASAL | 99 refills | Status: DC

## 2017-07-22 MED ORDER — OLOPATADINE HCL 0.1 % OP SOLN: 1.0000 [drp] | Freq: Two times a day (BID) | OPHTHALMIC | 12 refills | Status: DC

## 2017-07-22 MED ORDER — ALBUTEROL SULFATE HFA 108 (90 BASE) MCG/ACT IN AERS: 2.0000 | INHALATION_SPRAY | RESPIRATORY_TRACT | 99 refills | Status: DC | PRN

## 2017-07-22 MED ORDER — ESTRADIOL 0.1 MG/24HR TD PTTW: MEDICATED_PATCH | TRANSDERMAL | 0 refills | Status: DC

## 2017-07-22 MED ORDER — OMEPRAZOLE 20 MG PO CPDR: DELAYED_RELEASE_CAPSULE | ORAL | 3 refills | Status: DC

## 2017-07-22 NOTE — Patient Instructions (Addendum)
Dr Vaishnav Demartin will call you if your cholesterol and basic metabolic panel blood tests are not good. Otherwise he will send you a letter.  If you sign up for MyChart online, you will be able to see your test results once Dr Rayon Mcchristian has reviewed them.  If you do not hear from us with in 2 weeks please call our office.  Start Patanol Eye drops for allergic conjunctivitis, use daily during times when you are having frequent itching eyes.  Using Over The Counter allergy eye drops only for a week at a time, prolonged use can lead to worsening of the allergic eye symptoms.   Keep an eye on the darkness on your left foot little toe. If it has not gone away in 3 months, let Dr Praveen Coia know.  It may need a Dermatology referral to make sure it is not a skin cancer under the toenail.

## 2017-07-22 NOTE — Progress Notes (Signed)
   Subjective:    Patient ID: Sara Patrick, female    DOB: [birth year], 47 y.o.   MRN: [id number] Sara Patrick is alone Sources of clinical information for visit is/are patient. Nursing assessment for this office visit was reviewed with the patient for accuracy and revision.  Previous Report(s) Reviewed: none  Depression screen PHQ 2/9 07/22/2017  Decreased Interest 0  Down, Depressed, Hopeless 0  PHQ - 2 Score 0   Fall Risk  03/30/2014  Falls in the past year? No  Risk for fall due to : History of fall(s)    HPI  Problem List Items Addressed This Visit      High   Breast mass, left - Primary - Dx in early 2018 on mammograqphy, left br medial no mass seen on dx US - pt performs self exams without palp mass - no nipple drainage nor discharge     Low   Perennial allergic rhinitis with seasonal variation - Using Flonase daily.  - Itching eyes, l>r - no sneezing, no sore or scratching throat. No itching nose, no rhinorrhea   Relevant Medications   olopatadine (PATANOL) 0.1 % ophthalmic solution   Mild intermittent asthma - no use of albuteraol in last years - no flares in last year - no cough with exercise.  [facility] for 1 to 2 hours at a time.    Relevant Medications   albuterol (VENTOLIN HFA) 108 (90 Base) MCG/ACT inhaler   GASTROESOPHAGEAL REFLUX, NO ESOPHAGITIS - If misses dose of omeprazole, reflux returns strong - no diarrhea   Relevant Medications   omeprazole (PRILOSEC) 20 MG capsule   Menopausal and postmenopausal disorder - Using vivell dot as directred.  No hotflasshes. No fractures - no leg swelling, no sob, no cp, no limb weakness or numbness   Relevant Medications   estradiol (VIVELLE-DOT) 0.1 MG/24HR patch     Unprioritized   High risk medication use   Relevant Orders   Basic Metabolic Panel      Skin lesion - noted  aboutr a month ago, no hx of lesion.  No hx skin cancers. No hx nail disorders.  No recalled  trauma to toe or new tight shoes.  Not painful, not itching.  No smoking Review of Systems     Objective:   Physical Exam VS reviewed GEN: Alert, Cooperative, Groomed, NAD HEENT: PERRL; EAC bilaterally not occluded, TM's translucent with normal LM, (+) LR;                No cervical LAN, No thyromegaly, No palpable masses COR: RRR, No M/G/R, No JVD, Normal PMI size and location LUNGS: BCTA, No Acc mm use, speaking in full sentences BREAST: normal br contours, no dippling, no expressible nipple dc, no asymmetry, no palpable masses, no axillary or [place] ALN on either side.  No palpable mass medial left breast  ABDOMEN: (+)BS, soft, NT, ND, No HSM, No palpable masses EXT: No peripheral leg edema. Feet without deformity or lesions. Palpable bilateral pedal pulses.  SKIN: left 5th toe, medial nail with darker uniform medial and brown lasteral, approx 2 mm wide and 4 mm long,  No palpaple elevation, extends over cuticle, no tensder,  Gait: Normal speed, No significant path deviation, Step through +,  Psych: Normal affect/thought/speech/language    Assessment & Plan:  @DIAGNOSISWITHCOMMENTS @,

## 2017-07-23 ENCOUNTER — Encounter: Payer: Self-pay | Admitting: Family Medicine

## 2017-07-23 DIAGNOSIS — S90229A Contusion of unspecified lesser toe(s) with damage to nail, initial encounter: Secondary | ICD-10-CM

## 2017-07-23 HISTORY — DX: Contusion of unspecified lesser toe(s) with damage to nail, initial encounter: S90.229A

## 2017-07-23 LAB — BASIC METABOLIC PANEL
BUN/Creatinine Ratio: 10 (ref 9–23)
BUN: 7 mg/dL (ref 6–24)
CALCIUM: 9.6 mg/dL (ref 8.7–10.2)
CHLORIDE: 102 mmol/L (ref 96–106)
CO2: 24 mmol/L (ref 20–29)
Creatinine, Ser: 0.7 mg/dL (ref 0.57–1.00)
GFR calc Af Amer: 119 mL/min/{1.73_m2} (ref >59)
GFR calc non Af Amer: 104 mL/min/{1.73_m2} (ref >59)
Glucose: 95 mg/dL (ref 65–99)
POTASSIUM: 4.3 mmol/L (ref 3.5–5.2)
Sodium: 140 mmol/L (ref 134–144)

## 2017-07-23 LAB — LIPID PANEL
CHOLESTEROL TOTAL: 181 mg/dL (ref 100–199)
Chol/HDL Ratio: 2.5 ratio (ref 0.0–4.4)
HDL: 72 mg/dL (ref >39)
LDL Calculated: 98 mg/dL (ref 0–99)
TRIGLYCERIDES: 53 mg/dL (ref 0–149)
VLDL Cholesterol Cal: 11 mg/dL (ref 5–40)

## 2017-07-23 NOTE — Assessment & Plan Note (Signed)
New problem Ddx: subungal resolving hematoma Vs other.  If not resolved in 3 months, pt advised that cancer is in dx and will need dermatology evaluation for subungal malignancy (very low likelihood at this point)

## 2017-07-23 NOTE — Assessment & Plan Note (Signed)
Established problem Controlled Continue current prn SABA therapy regiment.

## 2017-07-23 NOTE — Assessment & Plan Note (Signed)
Established issue Controlled hot flashes No evidence of ERT ADE Continue Vivelle ERT

## 2017-07-23 NOTE — Assessment & Plan Note (Signed)
Established problem Uncontrolled Itching eyes Continue daily Flonase  Start Patanol eye drops daily during exacerbations of allergic conjunctivitis

## 2017-07-23 NOTE — Assessment & Plan Note (Signed)
Established problem. Stable. No subjective or objective findings of mass or abnromal breast.  Repeat surveillance mammogram in 07/2017.

## 2017-07-23 NOTE — Assessment & Plan Note (Signed)
Established problem Controlled Continue current therapy regiment.  

## 2017-08-06 ENCOUNTER — Ambulatory Visit
Admission: RE | Admit: 2017-08-06 | Discharge: 2017-08-06 | Disposition: A | Discharge status: Home / Self Care | Payer: Managed Care, Other (non HMO) | Source: Ambulatory Visit | Attending: Family Medicine | Admitting: Family Medicine

## 2017-08-06 ENCOUNTER — Ambulatory Visit: Payer: Managed Care, Other (non HMO)

## 2017-08-06 DIAGNOSIS — N6320 Unspecified lump in the left breast, unspecified quadrant: Secondary | ICD-10-CM

## 2017-10-01 ENCOUNTER — Other Ambulatory Visit: Payer: Self-pay | Admitting: Family Medicine

## 2017-10-01 DIAGNOSIS — N959 Unspecified menopausal and perimenopausal disorder: Secondary | ICD-10-CM

## 2018-07-26 ENCOUNTER — Other Ambulatory Visit: Payer: Self-pay | Admitting: Family Medicine

## 2018-07-26 DIAGNOSIS — N6320 Unspecified lump in the left breast, unspecified quadrant: Secondary | ICD-10-CM

## 2018-07-28 ENCOUNTER — Ambulatory Visit (INDEPENDENT_AMBULATORY_CARE_PROVIDER_SITE_OTHER): Payer: Managed Care, Other (non HMO) | Admitting: Family Medicine

## 2018-07-28 ENCOUNTER — Other Ambulatory Visit: Payer: Self-pay

## 2018-07-28 ENCOUNTER — Encounter: Payer: Self-pay | Admitting: Family Medicine

## 2018-07-28 VITALS — BP 124/80 | HR 74 | Temp 98.3°F | Ht 61.0 in | Wt 174.0 lb

## 2018-07-28 DIAGNOSIS — Z131 Encounter for screening for diabetes mellitus: Secondary | ICD-10-CM

## 2018-07-28 DIAGNOSIS — M722 Plantar fascial fibromatosis: Secondary | ICD-10-CM

## 2018-07-28 DIAGNOSIS — E669 Obesity, unspecified: Secondary | ICD-10-CM | POA: Diagnosis not present

## 2018-07-28 DIAGNOSIS — Z6832 Body mass index (BMI) 32.0-32.9, adult: Secondary | ICD-10-CM

## 2018-07-28 DIAGNOSIS — S90222A Contusion of left lesser toe(s) with damage to nail, initial encounter: Secondary | ICD-10-CM

## 2018-07-28 DIAGNOSIS — J4520 Mild intermittent asthma, uncomplicated: Secondary | ICD-10-CM

## 2018-07-28 DIAGNOSIS — J309 Allergic rhinitis, unspecified: Secondary | ICD-10-CM

## 2018-07-28 DIAGNOSIS — K219 Gastro-esophageal reflux disease without esophagitis: Secondary | ICD-10-CM

## 2018-07-28 DIAGNOSIS — N959 Unspecified menopausal and perimenopausal disorder: Secondary | ICD-10-CM

## 2018-07-28 DIAGNOSIS — H1013 Acute atopic conjunctivitis, bilateral: Secondary | ICD-10-CM

## 2018-07-28 DIAGNOSIS — Z23 Encounter for immunization: Secondary | ICD-10-CM

## 2018-07-28 NOTE — Patient Instructions (Signed)
Plantar Fasciitis      Plantar fasciitis is a painful foot condition that affects the heel. It occurs when the band of tissue that connects the toes to the heel bone (plantar fascia) becomes irritated. This can happen as the result of exercising too much or doing other repetitive activities (overuse injury).  The pain from plantar fasciitis can range from mild irritation to severe pain that makes it difficult to walk or move. The pain is usually worse in the morning after sleeping, or after sitting or lying down for a while. Pain may also be worse after long periods of walking or standing.  What are the causes?  This condition may be caused by:  Standing for long periods of time.  Wearing shoes that do not have good arch support.  Doing activities that put stress on joints (high-impact activities), including running, aerobics, and ballet.  Being overweight.  An abnormal way of walking (gait).  Tight muscles in the back of your lower leg (calf).  High arches in your feet.  Starting a new athletic activity.  What are the signs or symptoms?  The main symptom of this condition is heel pain. Pain may:  Be worse with first steps after a time of rest, especially in the morning after sleeping or after you have been sitting or lying down for a while.  Be worse after long periods of standing still.  Decrease after 30-45 minutes of activity, such as gentle walking.  How is this diagnosed?  This condition may be diagnosed based on your medical history and your symptoms. Your health care provider may ask questions about your activity level. Your health care provider will do a physical exam to check for:  A tender area on the bottom of your foot.  A high arch in your foot.  Pain when you move your foot.  Difficulty moving your foot.  You may have imaging tests to confirm the diagnosis, such as:  X-rays.  Ultrasound.  MRI.  How is this treated?  Treatment for plantar fasciitis depends on how severe your condition is.  Treatment may include:  Rest, ice, applying pressure (compression), and raising the affected foot (elevation). This may be called RICE therapy. Your health care provider may recommend RICE therapy along with over-the-counter pain medicines to manage your pain.  Exercises to stretch your calves and your plantar fascia.  A splint that holds your foot in a stretched, upward position while you sleep (night splint).  Physical therapy to relieve symptoms and prevent problems in the future.  Injections of steroid medicine (cortisone) to relieve pain and inflammation.  Stimulating your plantar fascia with electrical impulses (extracorporeal shock wave therapy). This is usually the last treatment option before surgery.  Surgery, if other treatments have not worked after 12 months.  Follow these instructions at home:      Managing pain, stiffness, and swelling  If directed, put ice on the painful area:  Put ice in a plastic bag, or use a frozen bottle of water.  Place a towel between your skin and the bag or bottle.  Roll the bottom of your foot over the bag or bottle.  Do this for 20 minutes, 2-3 times a day.  Wear athletic shoes that have air-sole or gel-sole cushions, or try wearing soft shoe inserts that are designed for plantar fasciitis.  Raise (elevate) your foot above the level of your heart while you are sitting or lying down.  Activity  Avoid activities that cause pain.   Ask your health care provider what activities are safe for you.  Do physical therapy exercises and stretches as told by your health care provider.  Try activities and forms of exercise that are easier on your joints (low-impact). Examples include swimming, water aerobics, and biking.  General instructions  Take over-the-counter and prescription medicines only as told by your health care provider.  Wear a night splint while sleeping, if told by your health care provider. Loosen the splint if your toes tingle, become numb, or turn cold and  blue.  Maintain a healthy weight, or work with your health care provider to lose weight as needed.  Keep all follow-up visits as told by your health care provider. This is important.  Contact a health care provider if you:  Have symptoms that do not go away after caring for yourself at home.  Have pain that gets worse.  Have pain that affects your ability to move or do your daily activities.  Summary  Plantar fasciitis is a painful foot condition that affects the heel. It occurs when the band of tissue that connects the toes to the heel bone (plantar fascia) becomes irritated.  The main symptom of this condition is heel pain that may be worse after exercising too much or standing still for a long time.  Treatment varies, but it usually starts with rest, ice, compression, and elevation (RICE therapy) and over-the-counter medicines to manage pain.  This information is not intended to replace advice given to you by your health care provider. Make sure you discuss any questions you have with your health care provider.  Document Released: 03/03/2001 Document Revised: 04/05/2017 Document Reviewed: 04/05/2017  Elsevier Interactive Patient Education © 2019 [employer]

## 2018-07-28 NOTE — Progress Notes (Signed)
Subjective:    Patient ID: Sara Patrick, female    DOB: Jan 25, 1971, 48 y.o.   MRN: [id number] GRACELYNNE BENEDICT is alone Sources of clinical information for visit is/are patient. Nursing assessment for this office visit was reviewed with the patient for accuracy and revision.  Previous Report(s) Reviewed: pst medical record Depression screen PHQ 2/9 07/28/2018  Decreased Interest 0  Down, Depressed, Hopeless 0  PHQ - 2 Score 0   Fall Risk  03/30/2014  Falls in the past year? No  Risk for fall due to : History of fall(s)    HPI  Problem List Items Addressed This Visit    Perennial allergic rhinitis with seasonal variation - Using Flonase 2 spray each nostril daily.  - Itching eyes, Bil., (+) sneezing present but less with use of Flonase.  Sxs year round.  - Using patanol for itching watery eyes daily prn - Rate adequate sx control with Flonase/Patanol regular use.  Worsens if stops.  - no sneezing, no sore or scratching throat. No itching nose, no rhinorrhea   Relevant Medications   olopatadine (PATANOL) 0.1 % ophthalmic solution   Mild intermittent asthma - ~ use abluterol MDI prn twice in last year - pct "colds" or change in weather - No nighttime cough/SHOB/wheeze - no cough with exercise/SHOB.  Able to exercise aerobically at [facility] daily for 1 to 2 hours at a time.    Relevant Medications   albuterol (VENTOLIN HFA) 108 (90 Base) MCG/ACT inhaler   GASTROESOPHAGEAL REFLUX, NO ESOPHAGITIS -GERD - Pt has had this condition for several years - Takes omeprazole   At dose with frequency for 20 mg at a time with good control of symptoms.   - Condition interferes with nothing.  Pt rates their symptoms as moderately severe if misses even one dose of omeprazole.  - Diarrhea no, Pneumonia no,  weight loss no  , melena no, hematochezia no, Dysphagia no, Odynophagia no, Disease Monitoring Typical Symptoms:   Hoarseness: no Clearing throat: no  Excess throat mucus or postnasal drip:  no Difficulty swallowing food, liquids, or pills: no  Coughing after you ate or after lying down: no  Breathing difficulties or choking difficulties: no Troublesome or annoying cough: no Sensation of something sticking in your throat or a lump in your throat:  no Heartburn, indigestion, or stomach acid coming up: no History of EGD:  No hx EGD  Red Flag findings   Bleeding (hematemesis/melena):  no Odynophagia: no   Food sticking:  no   Weight Loss:  no  Melena/Hematochezia: no Hematochezia: NO Medication Monitoring Compliance:  yes   Recent Pneumonia  no    Other GERD interventions: none Diarrhea: no  Have attempted taking only PRN no.  Symptoms return within a day of missing dose omepr    Relevant Medications   omeprazole (PRILOSEC) 20 MG capsule   Menopausal and postmenopausal disorder - Using vivell dot as directred.  No hotflasshes. No sweats, no noctutnal VM sxs. No vaginal itching or burining,  - S/P TAH with BSO age 37 for ovarian endometriosis and fibroids - no leg swelling, no sob, no cp, no limb weakness or numbness - no headaches/migraines - no jaundice or RUQ pain   Relevant Medications   estradiol (VIVELLE-DOT) 0.1 MG/24HR patch     Unprioritized   High risk medication use   Relevant Orders   Basic Metabolic Panel  Obesity History elements: Onset / Duration: several years ago Location: truncal Severity: mild Acitvity:  exercising 1 -2 hours a day at gym with weights and aerobics High CH2O foods: unable to identify High Fat foods: unable to identify Course: insidious Treatment prior to visit: exercixe Context:  no prior back problems and no recurrent self limited episodes of low back pain in the past Independent Works as Production designer, 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  Active movement during day\      Left Heel Pain .Onset: 3 months ago about Location: left inner posterior to medial left foot and heel Quality: ache with intermittent sharp pain Severity: pain  with walking and getting up in morning Function: able to carry on work walking on concrete floor at work  Pattern: constant with brief sharp exacerbations Course: stable Radiation: no Relief: nothing tried.  No NSAIDS nor APAP Precipitant: none recelled Associated Symptoms:       Restricted ROM/stiffness/swelling:  no       Muscle ache/cramp/spasms: no              Muscle strength change: no weakness in ankle or foot push off       Change in sensation (dysesthesia/itch or numbness): no Functional Impact:       Change in social or recreation: no, still exercising 1-2 hours at gym almost daily                ADLs:       Change in mood/energy level: no       Change in relationships: no       Change in work activities: no               Last time worked: yesterday       Change in sleep from pain: no       Change in exercise: no  Trauma (Acute or Chronic): none Prior Diagnostic Testing or Treatments: none Relevant PMH/PSH: no history of heel pain    Skin lesion - location 5th toenail - noted  aboutr about 1 yr ago - It did not resolve.  Consulted with Dr Margo AyeHall (Derm) who removed toenail and biospy - no evience of malignancy.  Possibly old trauma.     SH: No smoking Review of Systems 11 point ROS form unremarkable.  Scanned into EMR.    Objective:   Physical Exam VS reviewed GEN: Alert, Cooperative, Groomed, NAD HEENT: PERRL; EAC bilaterally not occluded, TM's translucent with normal LM, (+) LR;                No cervical LAN, No thyromegaly, No palpable masses COR: RRR, No M/G/R LUNGS: BCTA, No Acc mm use, speaking in full sentences ABDOMEN: (+)BS, soft, NT, ND, No HSM, No palpable masses EXT: No peripheral leg edema. TTP left heel and medial posterior foot.  Pain with resisted toe flexion on left.  Low transvere arch left SKIN: left 5th toe nail plate just emerging from cuticle onto nail bed.  Appears normal nail at this time.  Medial and lateral nail folds with just  adjacent proximal nail fold with dark coloring (Blue-black).  No elevation.  Each ~ 3 mm length, 1-2 mm width.  No elevation.    Gait: Normal speed, No significant path deviation, Step through + Psych: Normal affect/thought/speech/language    Assessment & Plan:  Visit Problem List with A/P  Obesity Established problem Uncontrolled Ms Margo AyeHall is conscientious about exercising at gym almost daily. Her reported diet appears to contain lean meats, whole grains, and vegetables.  She denies fatty or refined sugar snacks.  I am uncertain what is in her diet that is contributing to her progressive weight gail.

## 2018-07-29 ENCOUNTER — Encounter: Payer: Self-pay | Admitting: Family Medicine

## 2018-07-29 DIAGNOSIS — M722 Plantar fascial fibromatosis: Secondary | ICD-10-CM

## 2018-07-29 HISTORY — DX: Plantar fascial fibromatosis: M72.2

## 2018-07-29 LAB — BASIC METABOLIC PANEL
BUN/Creatinine Ratio: 12 (ref 9–23)
BUN: 10 mg/dL (ref 6–24)
CALCIUM: 9.6 mg/dL (ref 8.7–10.2)
CHLORIDE: 100 mmol/L (ref 96–106)
CO2: 22 mmol/L (ref 20–29)
Creatinine, Ser: 0.82 mg/dL (ref 0.57–1.00)
GFR calc Af Amer: 98 mL/min/{1.73_m2} (ref >59)
GFR, EST NON AFRICAN AMERICAN: 85 mL/min/{1.73_m2} (ref >59)
GLUCOSE: 88 mg/dL (ref 65–99)
Potassium: 4.2 mmol/L (ref 3.5–5.2)
SODIUM: 139 mmol/L (ref 134–144)

## 2018-07-29 NOTE — Assessment & Plan Note (Signed)
Established problem Uncontrolled Itching eyes Continue daily Flonase  Continue PRN Patanol eye drops daily during exacerbations of allergic conjunctivitis

## 2018-07-29 NOTE — Assessment & Plan Note (Signed)
Adequate symptom control. Tolerating omeprazol medication. Continue current medication regiment. Check Vit B12 next annual

## 2018-07-29 NOTE — Assessment & Plan Note (Signed)
Adequate symptom control. Tolerating Vivelle dot medication. Continue current medication regiment.  Will paln to continue for 5 years beyond usual time of physiologic menopause.

## 2018-07-29 NOTE — Assessment & Plan Note (Signed)
New problem.  Discussed natural hx of condition Trial of cushioning heel, HEP and time. If not improving, then given pes planus, would refer to podiatry for evaluation and treatment of condition and underlying foot mechanics disorder.

## 2018-07-29 NOTE — Assessment & Plan Note (Signed)
Resolved. Not mlignant on bx by Dr Margo Aye (Derm) 2019.  Monitor

## 2018-07-29 NOTE — Assessment & Plan Note (Addendum)
Established problem Uncontrolled Sara Patrick is conscientious about exercising at gym almost daily. Her reported diet appears to contain lean meats, whole grains, and vegetables.  She denies fatty or refined sugar snacks.   I am uncertain what is in her diet that is contributing to her progressive weight gain.  Sara Offen is interested in consulting with Nutritionist about this condition.  Card for Dr Gerilyn Pilgrim given to patient to arrange consultation.   Will add TSH to BMET labs already drawn.

## 2018-07-29 NOTE — Assessment & Plan Note (Signed)
Established problem Controlled Continue current therapy regiment.  

## 2018-07-31 LAB — TSH: TSH: 0.616 u[IU]/mL (ref 0.450–4.500)

## 2018-07-31 LAB — SPECIMEN STATUS REPORT

## 2018-08-01 ENCOUNTER — Encounter: Payer: Self-pay | Admitting: Family Medicine

## 2018-08-02 ENCOUNTER — Other Ambulatory Visit: Payer: Self-pay | Admitting: Family Medicine

## 2018-08-02 DIAGNOSIS — K219 Gastro-esophageal reflux disease without esophagitis: Secondary | ICD-10-CM

## 2018-08-10 ENCOUNTER — Ambulatory Visit
Admission: RE | Admit: 2018-08-10 | Discharge: 2018-08-10 | Disposition: A | Discharge status: Home / Self Care | Payer: Managed Care, Other (non HMO) | Source: Ambulatory Visit | Attending: Family Medicine | Admitting: Family Medicine

## 2018-08-10 DIAGNOSIS — N6320 Unspecified lump in the left breast, unspecified quadrant: Secondary | ICD-10-CM

## 2018-08-19 ENCOUNTER — Other Ambulatory Visit: Payer: Self-pay | Admitting: Family Medicine

## 2018-08-22 NOTE — Telephone Encounter (Signed)
2nd request received. Jawanna Dykman, RN ([facility] RN)  

## 2018-08-31 ENCOUNTER — Other Ambulatory Visit: Payer: Self-pay | Admitting: Family Medicine

## 2018-08-31 DIAGNOSIS — J4520 Mild intermittent asthma, uncomplicated: Secondary | ICD-10-CM

## 2018-10-26 ENCOUNTER — Other Ambulatory Visit: Payer: Self-pay | Admitting: Family Medicine

## 2018-10-26 DIAGNOSIS — N959 Unspecified menopausal and perimenopausal disorder: Secondary | ICD-10-CM

## 2019-02-08 ENCOUNTER — Other Ambulatory Visit: Payer: Self-pay

## 2019-02-08 ENCOUNTER — Encounter (HOSPITAL_COMMUNITY): Payer: Self-pay | Admitting: Emergency Medicine

## 2019-02-08 ENCOUNTER — Emergency Department (HOSPITAL_COMMUNITY)
Admission: EM | Admit: 2019-02-08 | Discharge: 2019-02-09 | Disposition: A | Discharge status: Home / Self Care | Payer: Managed Care, Other (non HMO) | Attending: Physician Assistant | Admitting: Physician Assistant

## 2019-02-08 DIAGNOSIS — H5500 Unspecified nystagmus: Secondary | ICD-10-CM | POA: Insufficient documentation

## 2019-02-08 DIAGNOSIS — Z79899 Other long term (current) drug therapy: Secondary | ICD-10-CM | POA: Insufficient documentation

## 2019-02-08 DIAGNOSIS — K219 Gastro-esophageal reflux disease without esophagitis: Secondary | ICD-10-CM | POA: Diagnosis not present

## 2019-02-08 DIAGNOSIS — H8110 Benign paroxysmal vertigo, unspecified ear: Secondary | ICD-10-CM | POA: Diagnosis not present

## 2019-02-08 DIAGNOSIS — R112 Nausea with vomiting, unspecified: Secondary | ICD-10-CM | POA: Diagnosis not present

## 2019-02-08 DIAGNOSIS — R110 Nausea: Secondary | ICD-10-CM

## 2019-02-08 DIAGNOSIS — R42 Dizziness and giddiness: Secondary | ICD-10-CM | POA: Diagnosis present

## 2019-02-08 DIAGNOSIS — Z8709 Personal history of other diseases of the respiratory system: Secondary | ICD-10-CM | POA: Insufficient documentation

## 2019-02-08 LAB — COMPREHENSIVE METABOLIC PANEL
ALT: 38 U/L (ref 0–44)
AST: 37 U/L (ref 15–41)
Albumin: 4.3 g/dL (ref 3.5–5.0)
Alkaline Phosphatase: 56 U/L (ref 38–126)
Anion gap: 10 (ref 5–15)
BUN: 13 mg/dL (ref 6–20)
CO2: 25 mmol/L (ref 22–32)
Calcium: 8.8 mg/dL — ABNORMAL LOW (ref 8.9–10.3)
Chloride: 101 mmol/L (ref 98–111)
Creatinine, Ser: 0.73 mg/dL (ref 0.44–1.00)
GFR calc Af Amer: >60 mL/min (ref >60)
GFR calc non Af Amer: >60 mL/min (ref >60)
Glucose, Bld: 139 mg/dL — ABNORMAL HIGH (ref 70–99)
Potassium: 3.7 mmol/L (ref 3.5–5.1)
Sodium: 136 mmol/L (ref 135–145)
Total Bilirubin: 0.2 mg/dL — ABNORMAL LOW (ref 0.3–1.2)
Total Protein: 7.6 g/dL (ref 6.5–8.1)

## 2019-02-08 LAB — CBC
HCT: 38.9 % (ref 36.0–46.0)
Hemoglobin: 13.1 g/dL (ref 12.0–15.0)
MCH: 32.8 pg (ref 26.0–34.0)
MCHC: 33.7 g/dL (ref 30.0–36.0)
MCV: 97.5 fL (ref 80.0–100.0)
Platelets: 310 10*3/uL (ref 150–400)
RBC: 3.99 MIL/uL (ref 3.87–5.11)
RDW: 12 % (ref 11.5–15.5)
WBC: 7.8 10*3/uL (ref 4.0–10.5)
nRBC: 0 % (ref 0.0–0.2)

## 2019-02-08 LAB — I-STAT BETA HCG BLOOD, ED (MC, WL, AP ONLY): I-stat hCG, quantitative: <5 m[IU]/mL (ref <5)

## 2019-02-08 LAB — LIPASE, BLOOD: Lipase: 36 U/L (ref 11–51)

## 2019-02-08 MED ORDER — SODIUM CHLORIDE 0.9% FLUSH: 3.0000 mL | Freq: Once | INTRAVENOUS | Status: DC

## 2019-02-08 NOTE — ED Triage Notes (Signed)
Patient here from home via EMS with complaints of nausea, vomiting that stated 2 hours ago. Denies chest pain.

## 2019-02-08 NOTE — ED Notes (Signed)
Pt. Made aware for the need of urine specimen. 

## 2019-02-09 ENCOUNTER — Ambulatory Visit (HOSPITAL_COMMUNITY)
Admit: 2019-02-09 | Discharge: 2019-02-09 | Disposition: A | Discharge status: Home / Self Care | Payer: Managed Care, Other (non HMO) | Attending: Physician Assistant | Admitting: Physician Assistant

## 2019-02-09 DIAGNOSIS — Z79899 Other long term (current) drug therapy: Secondary | ICD-10-CM | POA: Diagnosis not present

## 2019-02-09 DIAGNOSIS — K219 Gastro-esophageal reflux disease without esophagitis: Secondary | ICD-10-CM | POA: Diagnosis not present

## 2019-02-09 DIAGNOSIS — H5500 Unspecified nystagmus: Secondary | ICD-10-CM | POA: Diagnosis not present

## 2019-02-09 DIAGNOSIS — H8110 Benign paroxysmal vertigo, unspecified ear: Secondary | ICD-10-CM | POA: Diagnosis not present

## 2019-02-09 DIAGNOSIS — Z8709 Personal history of other diseases of the respiratory system: Secondary | ICD-10-CM | POA: Diagnosis not present

## 2019-02-09 DIAGNOSIS — R112 Nausea with vomiting, unspecified: Secondary | ICD-10-CM | POA: Diagnosis not present

## 2019-02-09 DIAGNOSIS — R42 Dizziness and giddiness: Secondary | ICD-10-CM | POA: Diagnosis present

## 2019-02-09 LAB — URINALYSIS, ROUTINE W REFLEX MICROSCOPIC
Bilirubin Urine: NEGATIVE
Glucose, UA: NEGATIVE mg/dL
Hgb urine dipstick: NEGATIVE
Ketones, ur: 5 mg/dL — AB
Leukocytes,Ua: NEGATIVE
Nitrite: NEGATIVE
Protein, ur: 30 mg/dL — AB
Specific Gravity, Urine: 1.021 (ref 1.005–1.030)
pH: 8 (ref 5.0–8.0)

## 2019-02-09 LAB — TROPONIN I (HIGH SENSITIVITY)
Troponin I (High Sensitivity): 2 ng/L (ref <18)
Troponin I (High Sensitivity): 2 ng/L (ref <18)

## 2019-02-09 MED ORDER — ONDANSETRON 4 MG PO TBDP
4.0000 mg | ORAL_TABLET | Freq: Once | ORAL | Status: AC
  Administered 2019-02-09: 4 mg via ORAL
  Filled 2019-02-09: qty 1

## 2019-02-09 MED ORDER — DIAZEPAM 5 MG PO TABS: 5.0000 mg | ORAL_TABLET | Freq: Three times a day (TID) | ORAL | 0 refills | Status: DC | PRN

## 2019-02-09 MED ORDER — MECLIZINE HCL 25 MG PO TABS
50.0000 mg | ORAL_TABLET | Freq: Once | ORAL | Status: AC
  Administered 2019-02-09: 50 mg via ORAL
  Filled 2019-02-09: qty 2

## 2019-02-09 MED ORDER — ONDANSETRON HCL 4 MG/2ML IJ SOLN
4.0000 mg | Freq: Once | INTRAMUSCULAR | Status: DC
  Filled 2019-02-09: qty 2

## 2019-02-09 MED ORDER — DIAZEPAM 5 MG PO TABS
5.0000 mg | ORAL_TABLET | Freq: Once | ORAL | Status: AC
  Administered 2019-02-09: 5 mg via ORAL
  Filled 2019-02-09: qty 1

## 2019-02-09 MED ORDER — METOCLOPRAMIDE HCL 10 MG PO TABS: 10.0000 mg | ORAL_TABLET | Freq: Three times a day (TID) | ORAL | 0 refills | Status: DC | PRN

## 2019-02-09 NOTE — ED Notes (Signed)
*  DOWNTIME START* 

## 2019-02-09 NOTE — ED Notes (Signed)
EKG given to EDP,Molpus,MD., for review. 

## 2019-02-09 NOTE — ED Notes (Signed)
Pt aware that urine sample is needed.  

## 2019-02-09 NOTE — Discharge Instructions (Signed)
Return here as needed. Follow up with your primary doctor for a recheck.  °

## 2019-02-09 NOTE — ED Provider Notes (Addendum)
[facility] DEPT Provider Note   CSN: [id number] Arrival date & time: 02/08/19  2304    History   Chief Complaint Chief Complaint  Patient presents with  . Nausea  . Emesis    HPI Sara Patrick is a 48 y.o. female.     48 y.o female with a PMH of GERD, Peptic ulcer disease presents to the ED via EMS with a chief complaint of nausea, dizziness and emesis.  Patient reports she was at home this afternoon, she began to feel dizzy, states the room felt like it was spinning out of proportion, states this is worse with movement.  Has had some nausea along with multiple episodes of vomiting prior to arrival.Patient denies any previous history of vertigo. She has not taken any medication to help with her symptoms. She reports vomiting only occurs when she changes position from sitting to standing. She denies any fever, chest pain, headache, or shortness of breath.   The history is provided by the patient and medical records.  Emesis Associated symptoms: no abdominal pain, no arthralgias, no chills, no cough, no fever, no headaches and no sore throat     Past Medical History:  Diagnosis Date  . Acute rhinosinusitis 05/22/2016  . Ankle pain, right, chronic 02/17/2011  . Asthma, intermittent   . GASTROESOPHAGEAL REFLUX, NO ESOPHAGITIS 10/14/2007  . History of ankle sprain, right 01/30/2010  . History of endometriosis 08/19/2006  . Myopic astigmatism   . Overweight(278.02) 01/10/2009  . Patellofemoral dysfunction of left knee 05/10/2015  . Peptic ulcer disease   . Perennial allergic rhinitis with seasonal variation 10/14/2007   Qualifier: Diagnosis of  By: McDiarmid MD, Sherren Mocha    . RHINITIS, ALLERGIC 10/14/2007  . 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 Plantar fasciitis of left foot 07/29/2018  . Toenail lesion 07/23/2017  . Breast mass, left 02/01/2017  . Obesity 05/10/2015  . High risk  medication use 05/09/2015  . 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 Allergic conjunctivitis and rhinitis 10/14/2007  . Mild intermittent asthma 10/14/2007  . GASTROESOPHAGEAL REFLUX, NO ESOPHAGITIS 10/14/2007  . PEPTIC ULCER DIS., UNSPEC. W/O OBSTRUCTION 10/14/2007  . Menopausal and postmenopausal disorder 02/24/2007    Past Surgical History:  Procedure Laterality Date  . TOTAL ABDOMINAL HYSTERECTOMY W/ BILATERAL SALPINGOOPHORECTOMY  12/2005   Hysterectomy: Fibroids &L.ovarian endometrriosis, adenomyosis (07/07, Dr Erskin Burnet [OBGYN]) [     OB History    Gravida  2   Para  0   Term  0   Preterm  0   AB  2   Living  0     SAB      TAB      Ectopic      Multiple      Live Births               Home Medications    Prior to Admission medications   Medication Sig Start Date End Date Taking? Authorizing Provider  albuterol (PROVENTIL HFA;VENTOLIN HFA) 108 (90 Base) MCG/ACT inhaler INHALE 2 PUFFS INTO THE LUNGS EVERY 4 HOURS AS NEEDED FOR SHORTNESS OF BREATH OR WHEEZING 08/31/18   McDiarmid, Blane Ohara, MD  Calcium Carbonate-Vitamin D (CALCIUM 600+D) 600-400 MG-UNIT per tablet Take 1 tablet by mouth daily. 03/30/14   McDiarmid, Blane Ohara, MD  estradiol (VIVELLE-DOT) 0.1 MG/24HR patch APPLY 1 PATCH EXTERNALLY TO THE SKIN 2 TIMES A WEEK  10/31/18   McDiarmid, Leighton Roachodd D, MD  fluticasone (FLONASE) 50 MCG/ACT nasal spray SHAKE LIQUID AND USE 2 SPRAYS IN EACH NOSTRIL DAILY 08/22/18   McDiarmid, Leighton Roachodd D, MD  Multiple Vitamin (MULTIVITAMIN) tablet Take 2 tablets by mouth.    [provider]  olopatadine (PATANOL) 0.1 % ophthalmic solution Place 1 drop into both eyes 2 (two) times daily. 07/22/17   McDiarmid, Leighton Roachodd D, MD  omeprazole (PRILOSEC) 20 MG capsule TAKE 1 CAPSULE(20 MG) BY MOUTH DAILY 08/02/18   McDiarmid, Leighton Roachodd D, MD    Family History Family History  Problem Relation Age of Onset  . Hypertension Mother   . Lupus Mother   . Cancer Mother        Renal Cell Cancer     Social History Social History   Tobacco Use  . Smoking status: Never Smoker  . Smokeless tobacco: Never Used  Substance Use Topics  . Alcohol use: Yes    Alcohol/week: 10.0 standard drinks    Types: 7 Glasses of wine, 3 Standard drinks or equivalent per week  . Drug use: No     Allergies   Robitussin [guaifenesin]   Review of Systems Review of Systems  Constitutional: Negative for chills and fever.  HENT: Negative for ear pain and sore throat.   Eyes: Negative for pain and visual disturbance.  Respiratory: Negative for cough and shortness of breath.   Cardiovascular: Negative for chest pain and palpitations.  Gastrointestinal: Positive for nausea and vomiting. Negative for abdominal pain.  Genitourinary: Negative for dysuria and hematuria.  Musculoskeletal: Negative for arthralgias and back pain.  Skin: Negative for color change and rash.  Neurological: Positive for dizziness. Negative for seizures, syncope, light-headedness and headaches.  All other systems reviewed and are negative.    Physical Exam Updated Vital Signs BP 137/90   Pulse 89   Temp 97.9 F (36.6 C) (Oral)   Resp 18   Ht 5\' 1"  (1.549 m)   Wt 74.8 kg   SpO2 100%   BMI 31.18 kg/m   Physical Exam Vitals signs and nursing note reviewed.  Constitutional:      General: She is not in acute distress.    Appearance: She is well-developed.  HENT:     Head: Normocephalic and atraumatic.     Mouth/Throat:     Pharynx: No oropharyngeal exudate.  Eyes:     Extraocular Movements:     Right eye: Nystagmus present.     Left eye: Nystagmus present.     Conjunctiva/sclera:     Right eye: Right conjunctiva is not injected. No chemosis or hemorrhage.    Left eye: Left conjunctiva is not injected. No chemosis or hemorrhage.    Pupils: Pupils are equal, round, and reactive to light.     Comments: Horizontal nystagmus..   Neck:     Musculoskeletal: Normal range of motion.  Cardiovascular:     Rate and  Rhythm: Normal rate and regular rhythm.     Heart sounds: Normal heart sounds.     Comments: No bilateral pitting edema. Pulmonary:     Effort: Pulmonary effort is normal. No respiratory distress.     Breath sounds: Normal breath sounds.     Comments: Lungs are clear to auscultation.  No rhonchi, rales, wheezing. Abdominal:     General: Bowel sounds are normal. There is no distension.     Palpations: Abdomen is soft.     Tenderness: There is no abdominal tenderness.  Musculoskeletal:  General: No tenderness or deformity.     Right lower leg: No edema.     Left lower leg: No edema.  Skin:    General: Skin is warm and dry.  Neurological:     Mental Status: She is alert and oriented to person, place, and time.      ED Treatments / Results  Labs (all labs ordered are listed, but only abnormal results are displayed) Labs Reviewed  COMPREHENSIVE METABOLIC PANEL - Abnormal; Notable for the following components:      Result Value   Glucose, Bld 139 (*)    Calcium 8.8 (*)    Total Bilirubin 0.2 (*)    All other components within normal limits  URINALYSIS, ROUTINE W REFLEX MICROSCOPIC - Abnormal; Notable for the following components:   Ketones, ur 5 (*)    Protein, ur 30 (*)    Bacteria, UA RARE (*)    All other components within normal limits  LIPASE, BLOOD  CBC  I-STAT BETA HCG BLOOD, ED ([facility], [facility], [facility] ONLY)  TROPONIN I (HIGH SENSITIVITY)  TROPONIN I (HIGH SENSITIVITY)    EKG EKG Interpretation  Date/Time:  Thursday February 09 2019 00:40:05 EDT Ventricular Rate:  85 PR Interval:    QRS Duration: 82 QT Interval:  387 QTC Calculation: 461 R Axis:   41 Text Interpretation:  Sinus rhythm Prolonged PR interval No previous ECGs available Confirmed by Sara Patrick, John ([id number]) on 02/09/2019 12:44:09 AM   Radiology No results found.  Procedures Procedures (including critical care time)  Medications Ordered in ED Medications  sodium chloride flush (NS) 0.9 % injection 3  mL (has no administration in time range)  ondansetron (ZOFRAN-ODT) disintegrating tablet 4 mg (4 mg Oral Given 02/09/19 0036)  meclizine (ANTIVERT) tablet 50 mg (50 mg Oral Given 02/09/19 0036)     Initial Impression / Assessment and Plan / ED Course  I have reviewed the triage vital signs and the nursing notes.  Pertinent labs & imaging results that were available during my care of the patient were reviewed by me and considered in my medical decision making (see chart for details).     Patient with no pertinent past medical history presents to the ED with complaints of nausea, vomiting, dizziness.  Reports she had a sudden onset of spinning of the room.  She has not tried any medical therapy for relief of symptoms. During primary evaluation there is horizontal spinning on exam. She also reports some "clogging" of her ears this past week.   Urinalysis showed no nitrites, leukocytes, white blood cell count.  hCG was negative.  Troponin x2 were negative.  Lipase level was within normal limits.  CBC showed no leukocytosis, hemoglobin is within normal limits.  EKG showed no changes consistent with infarct or STEMI.  Patient was given p.o. meclizine 50 mg without improvement in symptoms  Patient was given 2 mg of Valium p.o. to help with her symptoms without improvement.  There is horizontal nystagmus on exam.  Patient signed out to oncoming team pending MRI of the brain.Suspect disposition likely on MRI Results.    Portions of this note were generated with Scientist, clinical (histocompatibility and immunogenetics)Dragon dictation software. Dictation errors may occur despite best attempts at proofreading.  Final Clinical Impressions(s) / ED Diagnoses   Final diagnoses:  Nausea  Dizziness    ED Discharge Orders    None       Claude MangesSoto, Monty Mccarrell, PA-C 02/09/19 0703    Claude MangesSoto, Jaretzy Lhommedieu, PA-C 02/09/19 0703    Sara Patrick, Sara RuizJohn,  MD 02/09/19 [id number]

## 2019-02-09 NOTE — ED Notes (Signed)
*  DOWNTIME COMPLETE. PLEASE REFER TO DOWNTIME FORMS* 

## 2019-07-27 ENCOUNTER — Other Ambulatory Visit: Payer: Self-pay | Admitting: Family Medicine

## 2019-07-27 DIAGNOSIS — K219 Gastro-esophageal reflux disease without esophagitis: Secondary | ICD-10-CM

## 2019-08-31 ENCOUNTER — Other Ambulatory Visit: Payer: Self-pay | Admitting: Family Medicine

## 2019-08-31 DIAGNOSIS — J4520 Mild intermittent asthma, uncomplicated: Secondary | ICD-10-CM

## 2019-09-07 ENCOUNTER — Other Ambulatory Visit: Payer: Self-pay

## 2019-09-07 ENCOUNTER — Ambulatory Visit: Payer: Managed Care, Other (non HMO) | Admitting: Family Medicine

## 2019-09-07 ENCOUNTER — Encounter: Payer: Self-pay | Admitting: Family Medicine

## 2019-09-07 VITALS — BP 124/70 | HR 80 | Ht 61.0 in | Wt 178.0 lb

## 2019-09-07 DIAGNOSIS — Z131 Encounter for screening for diabetes mellitus: Secondary | ICD-10-CM | POA: Diagnosis not present

## 2019-09-07 DIAGNOSIS — Z6830 Body mass index (BMI) 30.0-30.9, adult: Secondary | ICD-10-CM

## 2019-09-07 DIAGNOSIS — K219 Gastro-esophageal reflux disease without esophagitis: Secondary | ICD-10-CM

## 2019-09-07 DIAGNOSIS — H1013 Acute atopic conjunctivitis, bilateral: Secondary | ICD-10-CM

## 2019-09-07 DIAGNOSIS — N959 Unspecified menopausal and perimenopausal disorder: Secondary | ICD-10-CM

## 2019-09-07 DIAGNOSIS — E669 Obesity, unspecified: Secondary | ICD-10-CM

## 2019-09-07 DIAGNOSIS — J309 Allergic rhinitis, unspecified: Secondary | ICD-10-CM

## 2019-09-07 DIAGNOSIS — Z79899 Other long term (current) drug therapy: Secondary | ICD-10-CM | POA: Diagnosis not present

## 2019-09-07 DIAGNOSIS — J302 Other seasonal allergic rhinitis: Secondary | ICD-10-CM

## 2019-09-07 DIAGNOSIS — J3089 Other allergic rhinitis: Secondary | ICD-10-CM

## 2019-09-07 LAB — POCT GLYCOSYLATED HEMOGLOBIN (HGB A1C): Hemoglobin A1C: 5.6 % (ref 4.0–5.6)

## 2019-09-07 MED ORDER — OMEPRAZOLE 20 MG PO CPDR: 20.0000 mg | DELAYED_RELEASE_CAPSULE | Freq: Every day | ORAL | 3 refills | Status: DC

## 2019-09-07 MED ORDER — OLOPATADINE HCL 0.1 % OP SOLN: 1.0000 [drp] | Freq: Two times a day (BID) | OPHTHALMIC | 12 refills | Status: DC

## 2019-09-07 MED ORDER — FLUTICASONE PROPIONATE 50 MCG/ACT NA SUSP: 2.0000 | Freq: Every day | NASAL | 5 refills | Status: DC

## 2019-09-07 MED ORDER — ESTRADIOL 0.1 MG/24HR TD PTTW: 1.0000 | MEDICATED_PATCH | TRANSDERMAL | 3 refills | Status: DC

## 2019-09-07 NOTE — Progress Notes (Signed)
    SUBJECTIVE:   CHIEF COMPLAINT / HPI:   Presents today for a yearly physical.  Asthma/Allergy - uses albuterol mainly when gets a cold or other upper respiratory symptoms. Has not had to use it this year due to wearing masks and not getting sick. States that she may need a refill for eye drops as her eyes started to itch due to allergies starting up.  Estradiol - doing well. No jaundice, no signs or symptoms of DVT or PE  GERD - well controlled on omeprazole.  Weight - increase 13 pounds since last visit 1 year ago. Likely due to stress of her recent promotion at work. Has continued to exercise every morning. Meal preps but eats mostly in the evening with little intake throughout the day.  BPPV - one time incident. Went to ED was discharged after 14 hours. Took 2 weeks for her to feel back to baseline. Has not had any vertigo since then.  Mammogram - scheduled for mammogram next week. Expressed concern about lymphadenopathy following her recent COVID vaccine.   PERTINENT  PMH / PSH: Asthma, obesity, BPPV, GERD, radical hysterectomy. 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  OBJECTIVE:   BP 124/70   Pulse 80   Ht 5\' 1"  (1.549 m)   Wt 178 lb (80.7 kg)   SpO2 99%   BMI 33.63 kg/m   Physical Exam  Constitutional: She is oriented to person, place, and time and well-developed, well-nourished, and in no distress.  HENT:  Head: Normocephalic.  Right Ear: External ear normal.  Left Ear: External ear normal.  Mouth/Throat: Oropharynx is clear and moist.  Eyes: Pupils are equal, round, and reactive to light. Conjunctivae and EOM are normal.  Neck: No thyromegaly present.  Cardiovascular: Normal rate, regular rhythm, normal heart sounds and intact distal pulses. Exam reveals no gallop and no friction rub.  No murmur heard. Pulmonary/Chest: Effort normal and breath sounds normal. No respiratory distress. She has no wheezes. She has no rales. She exhibits no  tenderness.  Abdominal: Soft. There is no abdominal tenderness.  Lymphadenopathy:    She has no cervical adenopathy.  Neurological: She is alert and oriented to person, place, and time.     ASSESSMENT/PLAN:   Allergic conjunctivitis and rhinitis Refill olopatadine  High risk medication use Continue Estradiol as prescribed. Will further discuss titrating off with the goal of coming off estradiol at the next visit. Counseled patient to monitor for side effect (DVT, PE, Jaundice)    GASTROESOPHAGEAL REFLUX, NO ESOPHAGITIS Continue omeprazole as prescribed. Will further discuss titrating off with the goal of coming off omeprazole at the next visit   Obesity Continue daily exercise routine. Gave her contact information of our nutritionist to discuss healthy meal planning.  BPPV - call if she experiences another episode of vertigo. Will refer her to vestibular rehab if this happens.  Mammogram - continue with schedules appointment despite covid vaccine. If abnormal will recheck in 3 months.   Follow up: 1 year for yearly physical  Shondrea Steinert M Makayla Confer, Medical Student [facility]

## 2019-09-07 NOTE — Assessment & Plan Note (Addendum)
Continue Estradiol as prescribed. Will further discuss titrating off with the goal of coming off estradiol at the next visit. Counseled patient to monitor for side effect (DVT, PE, Jaundice)

## 2019-09-07 NOTE — Assessment & Plan Note (Signed)
Continue omeprazole as prescribed. Will further discuss titrating off with the goal of coming off omeprazole at the next visit

## 2019-09-07 NOTE — Assessment & Plan Note (Signed)
Continue daily exercise routine. Gave her contact information of our nutritionist to discuss healthy meal planning.

## 2019-09-07 NOTE — Patient Instructions (Addendum)
It was wonderful seeing you today!  Continue taking estradiol as prescribed. We will discuss coming off of this in more detail at next visit. Continue to monitor for leg pain and swelling, shortness of breath and chest pain, or jaundice. We refilled your olopatadine eye drops. Continue taking the omeprazole daily. We will discuss tapering off of this if possible at next visit.  If you experience another episode of vertigo, give [place] a call for referral to vestibular rehab. Call nutritionist (info on card) if interested and continue exercise routine daily. We would like you to go ahead and get the mammogram as scheduled.

## 2019-09-07 NOTE — Assessment & Plan Note (Signed)
Refill olopatadine

## 2019-09-08 ENCOUNTER — Other Ambulatory Visit: Payer: Self-pay | Admitting: Family Medicine

## 2019-09-08 DIAGNOSIS — Z1231 Encounter for screening mammogram for malignant neoplasm of breast: Secondary | ICD-10-CM

## 2019-09-15 ENCOUNTER — Other Ambulatory Visit: Payer: Self-pay

## 2019-09-15 ENCOUNTER — Ambulatory Visit
Admission: RE | Admit: 2019-09-15 | Discharge: 2019-09-15 | Disposition: A | Discharge status: Home / Self Care | Payer: Managed Care, Other (non HMO) | Source: Ambulatory Visit

## 2019-09-15 DIAGNOSIS — Z1231 Encounter for screening mammogram for malignant neoplasm of breast: Secondary | ICD-10-CM

## 2019-09-29 IMAGING — MG DIGITAL DIAGNOSTIC BILATERAL MAMMOGRAM WITH TOMO AND CAD
8 series · 8 of 24 positions shown · non-contrast
Comparison: Previous exam(s).

CLINICAL DATA: 47-year-old female presenting for final follow-up of
a probably benign mass in the left breast.

EXAM:
DIGITAL DIAGNOSTIC BILATERAL MAMMOGRAM WITH CAD AND TOMO

[R MLO synth-2D]
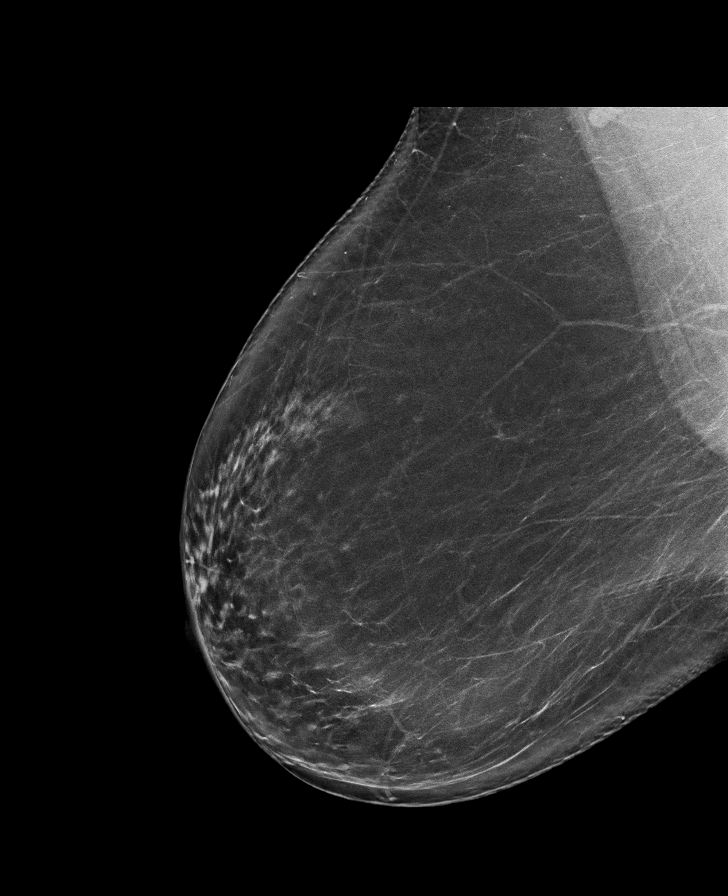

[R CC synth-2D]
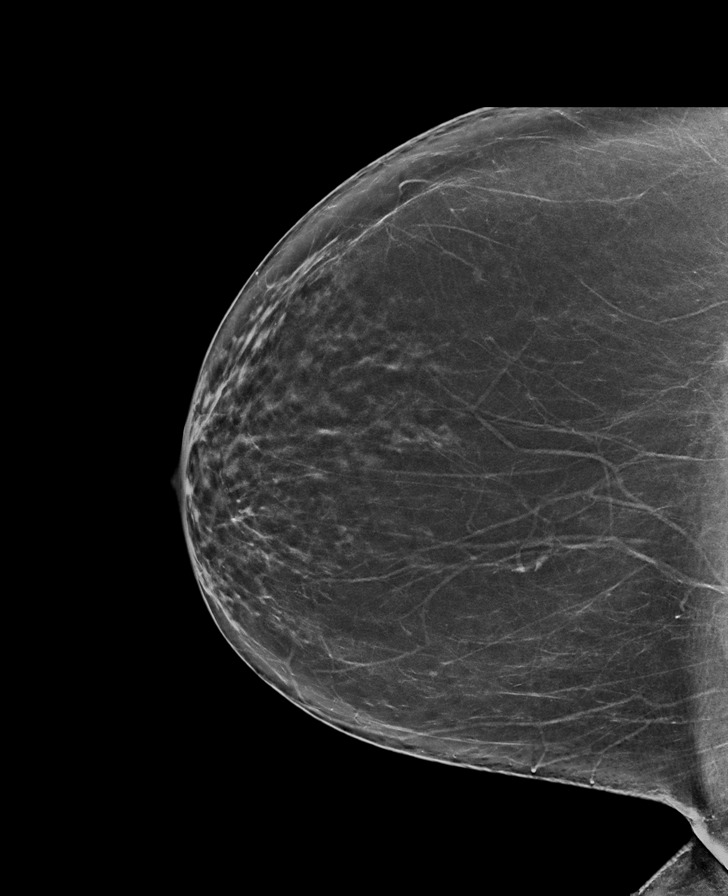

[L MLO synth-2D]
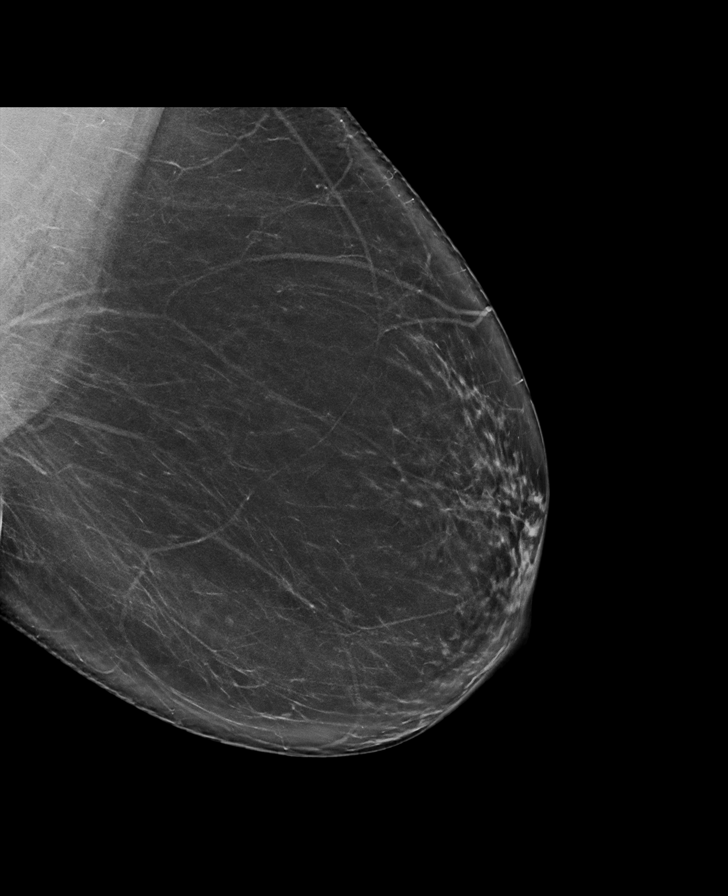

[L CC synth-2D]
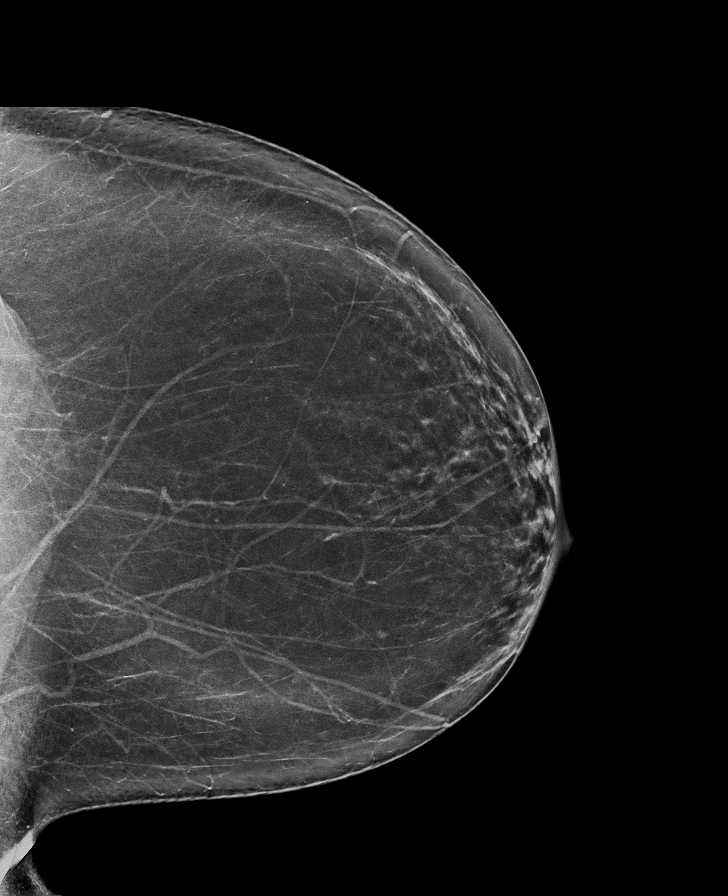

[L CC tomo · tomo slice 42/83.0]
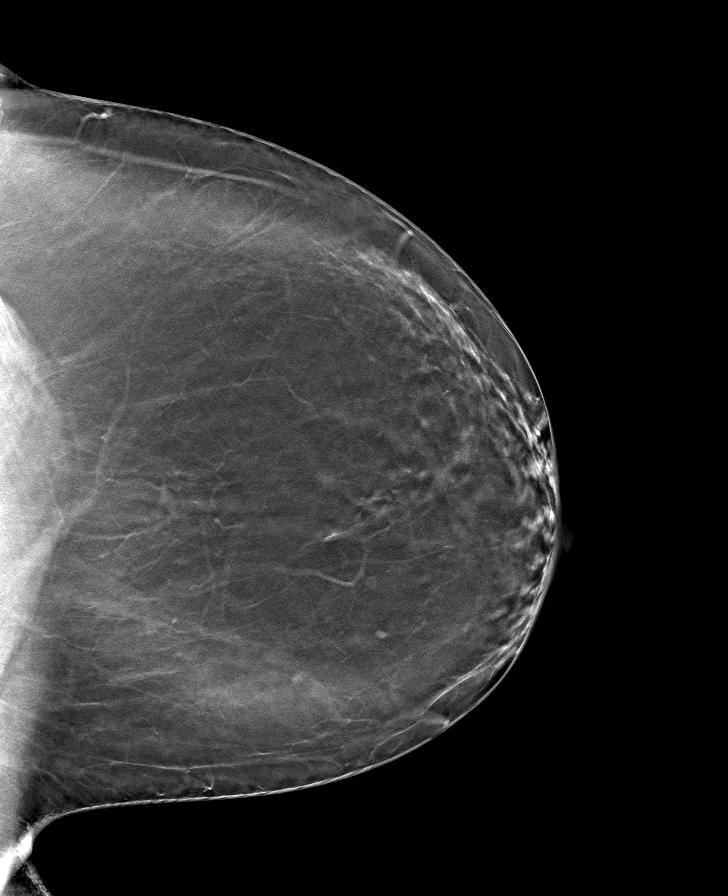

[R CC tomo · tomo slice 41/81.0]
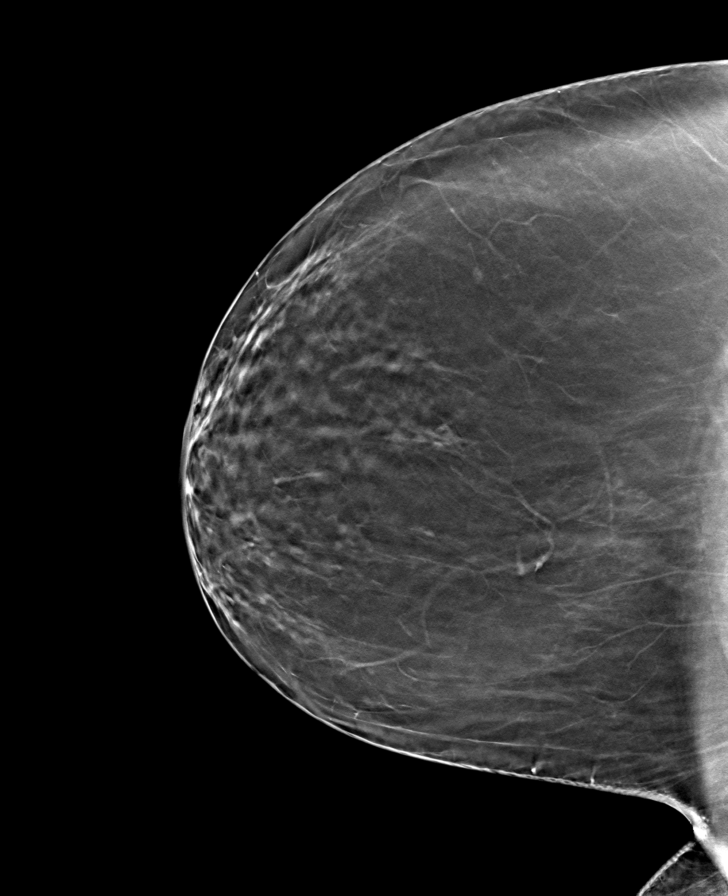

[R MLO tomo · tomo slice 47/93.0]
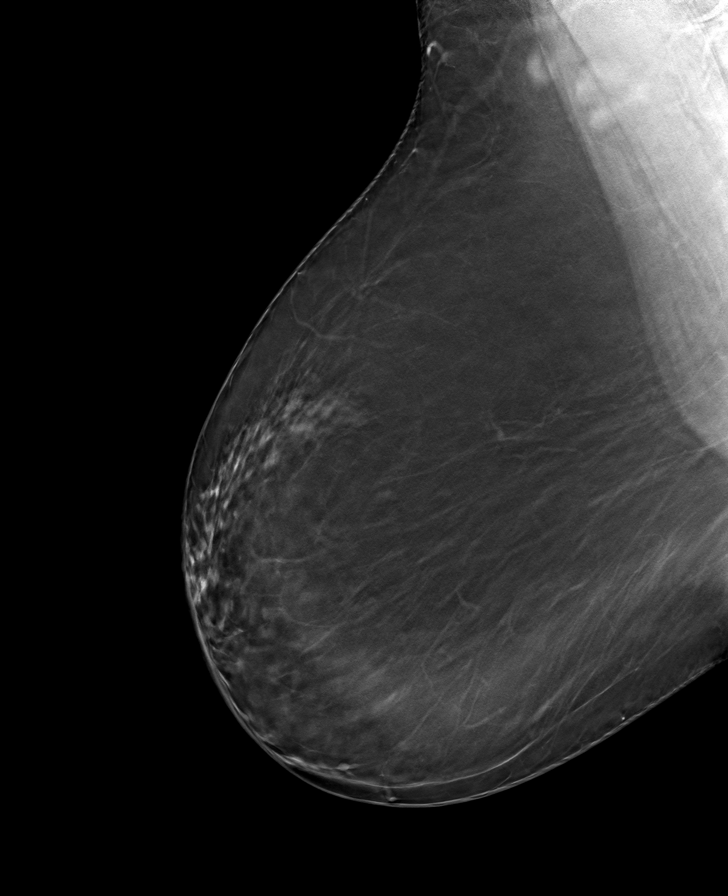

[L MLO tomo · tomo slice 46/91.0]
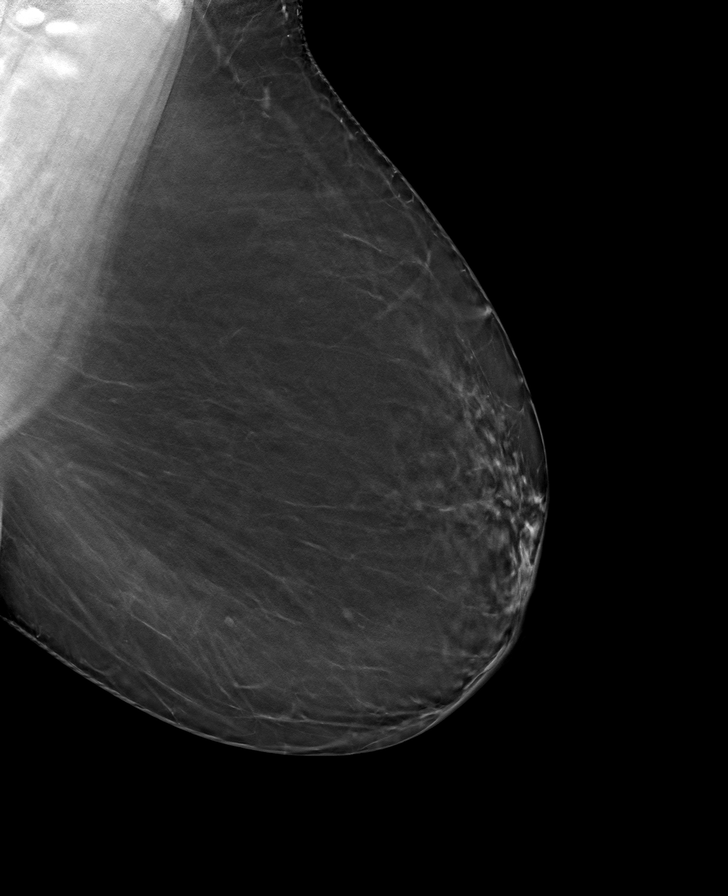

[8 of 24 positions shown; findings below may reference images not displayed]

ACR Breast Density Category b: There are scattered areas of
fibroglandular density.
FINDINGS: The tiny 2-3 mm oval circumscribed mass in the medial left breast is
stable. No suspicious calcifications, masses or areas of distortion
are seen in the bilateral breasts.

Mammographic images were processed with CAD.
IMPRESSION: 1. The tiny mass in the medial left breast has demonstrated 2 years
of stability, and is therefore benign.

2.  No mammographic evidence of malignancy in the bilateral breasts.

RECOMMENDATION:
Screening mammogram in one year.(Code:[id number])

I have discussed the findings and recommendations with the patient.
Results were also provided in writing at the conclusion of the
visit. If applicable, a reminder letter will be sent to the patient
regarding the next appointment.

BI-RADS CATEGORY  2: Benign.

## 2020-05-07 ENCOUNTER — Other Ambulatory Visit: Payer: Self-pay | Admitting: Family Medicine

## 2020-08-30 ENCOUNTER — Other Ambulatory Visit: Payer: Self-pay | Admitting: Family Medicine

## 2020-08-30 DIAGNOSIS — Z1231 Encounter for screening mammogram for malignant neoplasm of breast: Secondary | ICD-10-CM

## 2020-09-12 ENCOUNTER — Ambulatory Visit: Payer: Managed Care, Other (non HMO) | Admitting: Family Medicine

## 2020-09-12 ENCOUNTER — Encounter: Payer: Self-pay | Admitting: Family Medicine

## 2020-09-12 ENCOUNTER — Other Ambulatory Visit: Payer: Self-pay | Admitting: Family Medicine

## 2020-09-12 ENCOUNTER — Other Ambulatory Visit: Payer: Self-pay

## 2020-09-12 VITALS — BP 173/109 | HR 77 | Ht 61.0 in | Wt 170.4 lb

## 2020-09-12 DIAGNOSIS — E669 Obesity, unspecified: Secondary | ICD-10-CM

## 2020-09-12 DIAGNOSIS — Z131 Encounter for screening for diabetes mellitus: Secondary | ICD-10-CM

## 2020-09-12 DIAGNOSIS — J309 Allergic rhinitis, unspecified: Secondary | ICD-10-CM

## 2020-09-12 DIAGNOSIS — J3089 Other allergic rhinitis: Secondary | ICD-10-CM

## 2020-09-12 DIAGNOSIS — J302 Other seasonal allergic rhinitis: Secondary | ICD-10-CM

## 2020-09-12 DIAGNOSIS — Z79899 Other long term (current) drug therapy: Secondary | ICD-10-CM

## 2020-09-12 DIAGNOSIS — J4520 Mild intermittent asthma, uncomplicated: Secondary | ICD-10-CM

## 2020-09-12 DIAGNOSIS — K219 Gastro-esophageal reflux disease without esophagitis: Secondary | ICD-10-CM

## 2020-09-12 DIAGNOSIS — Z6833 Body mass index (BMI) 33.0-33.9, adult: Secondary | ICD-10-CM

## 2020-09-12 DIAGNOSIS — N959 Unspecified menopausal and perimenopausal disorder: Secondary | ICD-10-CM

## 2020-09-12 DIAGNOSIS — Z1211 Encounter for screening for malignant neoplasm of colon: Secondary | ICD-10-CM

## 2020-09-12 DIAGNOSIS — H1013 Acute atopic conjunctivitis, bilateral: Secondary | ICD-10-CM | POA: Diagnosis not present

## 2020-09-12 LAB — POCT GLYCOSYLATED HEMOGLOBIN (HGB A1C): Hemoglobin A1C: 5.4 % (ref 4.0–5.6)

## 2020-09-12 MED ORDER — FAMOTIDINE 40 MG PO TABS: 40.0000 mg | ORAL_TABLET | Freq: Every day | ORAL | 3 refills | Status: DC

## 2020-09-12 MED ORDER — AZELASTINE HCL 0.05 % OP SOLN: 1.0000 [drp] | Freq: Two times a day (BID) | OPHTHALMIC | 99 refills | Status: DC

## 2020-09-12 MED ORDER — OMEPRAZOLE 20 MG PO CPDR: 20.0000 mg | DELAYED_RELEASE_CAPSULE | Freq: Every day | ORAL | 0 refills | Status: DC

## 2020-09-12 NOTE — Patient Instructions (Addendum)
If the new astelin eye drop do not work, please let Dr Tupac Jeffus me know with a message through MyChart.  Dr Effrey Davidow will send in a prescription for Pataday, though it may not have any insurance coverage.   Try switching to Pepcid (famotadine) acid blocker instead of the omeprazole.  Pepcid has much less side effects on the body than omeprazole, especially as we get older.   If you have a flare up of your indigestion, return to taking the omeprazole    Gastroesophageal Reflux Disease, Adult Gastroesophageal reflux (GER) happens when acid from the stomach flows up into the tube that connects the mouth and the stomach (esophagus). Normally, food travels down the esophagus and stays in the stomach to be digested. However, when a person has GER, food and stomach acid sometimes move back up into the esophagus. If this becomes a more serious problem, the person may be diagnosed with a disease called gastroesophageal reflux disease (GERD). GERD occurs when the reflux:  Happens often.  Causes frequent or severe symptoms.  Causes problems such as damage to the esophagus. When stomach acid comes in contact with the esophagus, the acid may cause inflammation in the esophagus. Over time, GERD may create small holes (ulcers) in the lining of the esophagus. What are the causes? This condition is caused by a problem with the muscle between the esophagus and the stomach (lower esophageal sphincter, or LES). Normally, the LES muscle closes after food passes through the esophagus to the stomach. When the LES is weakened or abnormal, it does not close properly, and that allows food and stomach acid to go back up into the esophagus. The LES can be weakened by certain dietary substances, medicines, and medical conditions, including:  Tobacco use.  Pregnancy.  Having a hiatal hernia.  Alcohol use.  Certain foods and beverages, such as coffee, chocolate, onions, and peppermint. What increases the risk? You  are more likely to develop this condition if you:  Have an increased body weight.  Have a connective tissue disorder.  Take NSAIDs, such as ibuprofen. What are the signs or symptoms? Symptoms of this condition include:  Heartburn.  Difficult or painful swallowing and the feeling of having a lump in the throat.  A bitter taste in the mouth.  Bad breath and having a large amount of saliva.  Having an upset or bloated stomach and belching.  Chest pain. Different conditions can cause chest pain. Make sure you see your health care provider if you experience chest pain.  Shortness of breath or wheezing.  Ongoing (chronic) cough or a nighttime cough.  Wearing away of tooth enamel.  Weight loss. How is this diagnosed? This condition may be diagnosed based on a medical history and a physical exam. To determine if you have mild or severe GERD, your health care provider may also monitor how you respond to treatment. You may also have tests, including:  A test to examine your stomach and esophagus with a small camera (endoscopy).  A test that measures the acidity level in your esophagus.  A test that measures how much pressure is on your esophagus.  A barium swallow or modified barium swallow test to show the shape, size, and functioning of your esophagus. How is this treated? Treatment for this condition may vary depending on how severe your symptoms are. Your health care provider may recommend:  Changes to your diet.  Medicine.  Surgery. The goal of treatment is to help relieve your symptoms and to prevent  complications. Follow these instructions at home: Eating and drinking  Follow a diet as recommended by your health care provider. This may involve avoiding foods and drinks such as: ? Coffee and tea, with or without caffeine. ? Drinks that contain alcohol. ? Energy drinks and sports drinks. ? Carbonated drinks or sodas. ? Chocolate and cocoa. ? Peppermint and mint  flavorings. ? Garlic and onions. ? Horseradish. ? Spicy and acidic foods, including peppers, chili powder, curry powder, vinegar, hot sauces, and barbecue sauce. ? Citrus fruit juices and citrus fruits, such as oranges, lemons, and limes. ? Tomato-based foods, such as red sauce, chili, salsa, and pizza with red sauce. ? Fried and fatty foods, such as donuts, french fries, potato chips, and high-fat dressings. ? High-fat meats, such as hot dogs and fatty cuts of red and white meats, such as rib eye steak, sausage, ham, and bacon. ? High-fat dairy items, such as whole milk, butter, and cream cheese.  Eat small, frequent meals instead of large meals.  Avoid drinking large amounts of liquid with your meals.  Avoid eating meals during the 2-3 hours before bedtime.  Avoid lying down right after you eat.  Do not exercise right after you eat.   Lifestyle  Do not use any products that contain nicotine or tobacco. These products include cigarettes, chewing tobacco, and vaping devices, such as e-cigarettes. If you need help quitting, ask your health care provider.  Try to reduce your stress by using methods such as yoga or meditation. If you need help reducing stress, ask your health care provider.  If you are overweight, reduce your weight to an amount that is healthy for you. Ask your health care provider for guidance about a safe weight loss goal.   General instructions  Pay attention to any changes in your symptoms.  Take over-the-counter and prescription medicines only as told by your health care provider. Do not take aspirin, ibuprofen, or other NSAIDs unless your health care provider told you to take these medicines.  Wear loose-fitting clothing. Do not wear anything tight around your waist that causes pressure on your abdomen.  Raise (elevate) the head of your bed about 6 inches (15 cm). You can use a wedge to do this.  Avoid bending over if this makes your symptoms worse.  Keep  all follow-up visits. This is important. Contact a health care provider if:  You have: ? New symptoms. ? Unexplained weight loss. ? Difficulty swallowing or it hurts to swallow. ? Wheezing or a persistent cough. ? A hoarse voice.  Your symptoms do not improve with treatment. Get help right away if:  You have sudden pain in your arms, neck, jaw, teeth, or back.  You suddenly feel sweaty, dizzy, or light-headed.  You have chest pain or shortness of breath.  You vomit and the vomit is green, yellow, or black, or it looks like blood or coffee grounds.  You faint.  You have stool that is red, bloody, or black.  You cannot swallow, drink, or eat. These symptoms may represent a serious problem that is an emergency. Do not wait to see if the symptoms will go away. Get medical help right away. Call your local emergency services (911 in the [place] Do not drive yourself to the hospital. Summary  Gastroesophageal reflux happens when acid from the stomach flows up into the esophagus. GERD is a disease in which the reflux happens often, causes frequent or severe symptoms, or causes problems such as damage to the  esophagus.  Treatment for this condition may vary depending on how severe your symptoms are. Your health care provider may recommend diet and lifestyle changes, medicine, or surgery.  Contact a health care provider if you have new or worsening symptoms.  Take over-the-counter and prescription medicines only as told by your health care provider. Do not take aspirin, ibuprofen, or other NSAIDs unless your health care provider told you to do so.  Keep all follow-up visits as told by your health care provider. This is important. This information is not intended to replace advice given to you by your health care provider. Make sure you discuss any questions you have with your health care provider. Document Revised: 12/18/2019 Document Reviewed: 12/18/2019 Elsevier Patient Education   2021 [employer].

## 2020-09-13 ENCOUNTER — Encounter: Payer: Self-pay | Admitting: Family Medicine

## 2020-09-13 LAB — BASIC METABOLIC PANEL
BUN/Creatinine Ratio: 11 (ref 9–23)
BUN: 8 mg/dL (ref 6–24)
CO2: 21 mmol/L (ref 20–29)
Calcium: 9 mg/dL (ref 8.7–10.2)
Chloride: 104 mmol/L (ref 96–106)
Creatinine, Ser: 0.7 mg/dL (ref 0.57–1.00)
Glucose: 102 mg/dL — ABNORMAL HIGH (ref 65–99)
Potassium: 3.8 mmol/L (ref 3.5–5.2)
Sodium: 142 mmol/L (ref 134–144)
eGFR: 105 mL/min/{1.73_m2} (ref >59)

## 2020-09-13 NOTE — Assessment & Plan Note (Addendum)
Established problem Controlled  After discussion of risks of longterm PPI use, agreed to trial of switching to famotidine 40 milligram daily from omeprazole 20 milligram daily. Should trial fail, Sara Patrick is to return to her use of omeprazole 20 milligram daily.  Could then trial qod  Pepcid alt qod  omeprazole.

## 2020-09-13 NOTE — Assessment & Plan Note (Signed)
Established problem Uncontrolled Start: Astelin ophth. Solution drops Stop: Patanol bc not covered by insurance.  Continue: Flonase  If inadequate sxs control with Astelin ophth drops, will prescribed Pataday even though not covered by insurance. Sara Patrick aware of non-coverage of Pataday

## 2020-09-13 NOTE — Progress Notes (Signed)
Sara Patrick is alone Sources of clinical information for visit is/are patient and past medical records. Nursing assessment for this office visit was reviewed with the patient for accuracy and revision.     Previous Report(s) Reviewed: lab reports and office notes  Depression screen [facility] 2/9 09/12/2020  Decreased Interest 0  Down, Depressed, Hopeless 0  PHQ - 2 Score 0  Altered sleeping 0  Tired, decreased energy 0  Change in appetite 0  Feeling bad or failure about yourself  0  Trouble concentrating 0  Moving slowly or fidgety/restless 0  Suicidal thoughts 0  PHQ-9 Score 0  Difficult doing work/chores Not difficult at all    Fall Risk  03/30/2014  Falls in the past year? No  Risk for fall due to : History of fall(s)    PHQ9 SCORE ONLY 09/12/2020 09/07/2019 07/28/2018  PHQ-9 Total Score 0 0 0    Adult vaccines due  Topic Date Due  . TETANUS/TDAP  07/28/2028    Health Maintenance Due  Topic Date Due  . COVID-19 Vaccine (1) Never done  . COLONOSCOPY (Pts 45-39yrs Insurance coverage will need to be confirmed)  Never done  . INFLUENZA VACCINE  01/21/2020      History/P.E. limitations: none  Adult vaccines due  Topic Date Due  . TETANUS/TDAP  07/28/2028   There are no preventive care reminders to display for this patient.  Health Maintenance Due  Topic Date Due  . COVID-19 Vaccine (1) Never done  . COLONOSCOPY (Pts 45-82yrs Insurance coverage will need to be confirmed)  Never done  . INFLUENZA VACCINE  01/21/2020     Chief Complaint  Patient presents with  . Physical  . Allergies    Pt stated that her eyes are very irritated.    Visit Problem List with A/P  Obesity Wt Readings from Last 3 Encounters:  09/12/20 170 lb 6.4 oz (77.3 kg)  09/07/19 178 lb (80.7 kg)  02/08/19 165 lb (74.8 kg)   Improving Using meausrments and app for portion control Remains active with aerobic exercie at least three times a week.   Mild intermittent asthma Established  problem. Stable.  No exacerbations since start of Covid pandemic and mask mandate.   Continue availability of albuterol METER DOSED INHALER prn   Menopausal and postmenopausal disorder Established problem. Stable. No signs of complications, medication side effects, or red flags. Continue current medications and other regiments.  Discussed option of trial off Vivelle after age 25 to assess need to control vasoactive symptoms.    Gastroesophageal reflux disease Established problem Controlled  After discussion of risks of longterm PPI use, agreed to trial of switching to famotidine 40 milligram daily from omeprazole 20 milligram daily. Should trial fail, Ms Whitworth is to return to her use of omeprazole 20 milligram daily.  Could then trial qod  Pepcid alt qod  omeprazole.  Allergic conjunctivitis and rhinitis Established problem Uncontrolled Start: Astelin ophth. Solution drops Stop: Patanol bc not covered by insurance.  Continue: Flonase  If inadequate sxs control with Astelin ophth drops, will prescribed Pataday even though not covered by insurance. Ms Bannister aware of non-coverage of Pataday  HM: Colonosocopy screening referral to Dr Adela Lank (GASTROENTEROLOGY)

## 2020-09-13 NOTE — Assessment & Plan Note (Signed)
Wt Readings from Last 3 Encounters:  09/12/20 170 lb 6.4 oz (77.3 kg)  09/07/19 178 lb (80.7 kg)  02/08/19 165 lb (74.8 kg)   Improving Using meausrments and app for portion control Remains active with aerobic exercie at least three times a week.

## 2020-09-13 NOTE — Assessment & Plan Note (Signed)
Established problem. Stable.  No exacerbations since start of Covid pandemic and mask mandate.   Continue availability of albuterol METER DOSED INHALER prn

## 2020-09-13 NOTE — Assessment & Plan Note (Signed)
Established problem. Stable. No signs of complications, medication side effects, or red flags. Continue current medications and other regiments.  Discussed option of trial off Vivelle after age 51 to assess need to control vasoactive symptoms.

## 2020-09-26 ENCOUNTER — Inpatient Hospital Stay: Admission: RE | Admit: 2020-09-26 | Payer: Managed Care, Other (non HMO) | Source: Ambulatory Visit

## 2020-09-26 DIAGNOSIS — Z1231 Encounter for screening mammogram for malignant neoplasm of breast: Secondary | ICD-10-CM

## 2020-11-14 ENCOUNTER — Other Ambulatory Visit: Payer: Self-pay

## 2020-11-14 ENCOUNTER — Ambulatory Visit
Admission: RE | Admit: 2020-11-14 | Discharge: 2020-11-14 | Disposition: A | Discharge status: Home / Self Care | Payer: Managed Care, Other (non HMO) | Source: Ambulatory Visit | Attending: Family Medicine | Admitting: Family Medicine

## 2020-11-14 DIAGNOSIS — Z1231 Encounter for screening mammogram for malignant neoplasm of breast: Secondary | ICD-10-CM

## 2021-05-07 ENCOUNTER — Other Ambulatory Visit: Payer: Self-pay | Admitting: Family Medicine

## 2021-09-25 ENCOUNTER — Other Ambulatory Visit: Payer: Self-pay | Admitting: Family Medicine

## 2021-09-25 DIAGNOSIS — N959 Unspecified menopausal and perimenopausal disorder: Secondary | ICD-10-CM

## 2021-10-05 ENCOUNTER — Other Ambulatory Visit: Payer: Self-pay | Admitting: Family Medicine

## 2021-10-05 DIAGNOSIS — K219 Gastro-esophageal reflux disease without esophagitis: Secondary | ICD-10-CM

## 2021-10-09 ENCOUNTER — Encounter: Payer: Self-pay | Admitting: Family Medicine

## 2021-10-09 ENCOUNTER — Ambulatory Visit (INDEPENDENT_AMBULATORY_CARE_PROVIDER_SITE_OTHER): Payer: Managed Care, Other (non HMO) | Admitting: Family Medicine

## 2021-10-09 VITALS — BP 140/88 | HR 78 | Ht 61.0 in | Wt 167.4 lb

## 2021-10-09 DIAGNOSIS — E669 Obesity, unspecified: Secondary | ICD-10-CM

## 2021-10-09 DIAGNOSIS — Z6833 Body mass index (BMI) 33.0-33.9, adult: Secondary | ICD-10-CM

## 2021-10-09 DIAGNOSIS — K219 Gastro-esophageal reflux disease without esophagitis: Secondary | ICD-10-CM | POA: Diagnosis not present

## 2021-10-09 DIAGNOSIS — J302 Other seasonal allergic rhinitis: Secondary | ICD-10-CM

## 2021-10-09 DIAGNOSIS — Z131 Encounter for screening for diabetes mellitus: Secondary | ICD-10-CM

## 2021-10-09 DIAGNOSIS — J309 Allergic rhinitis, unspecified: Secondary | ICD-10-CM

## 2021-10-09 DIAGNOSIS — H1013 Acute atopic conjunctivitis, bilateral: Secondary | ICD-10-CM

## 2021-10-09 DIAGNOSIS — Z79899 Other long term (current) drug therapy: Secondary | ICD-10-CM

## 2021-10-09 DIAGNOSIS — J4520 Mild intermittent asthma, uncomplicated: Secondary | ICD-10-CM

## 2021-10-09 DIAGNOSIS — N959 Unspecified menopausal and perimenopausal disorder: Secondary | ICD-10-CM

## 2021-10-09 DIAGNOSIS — J3089 Other allergic rhinitis: Secondary | ICD-10-CM

## 2021-10-09 MED ORDER — AZELASTINE HCL 0.05 % OP SOLN: 1.0000 [drp] | Freq: Two times a day (BID) | OPHTHALMIC | 99 refills | Status: DC

## 2021-10-09 MED ORDER — ALBUTEROL SULFATE HFA 108 (90 BASE) MCG/ACT IN AERS: INHALATION_SPRAY | RESPIRATORY_TRACT | 3 refills | Status: DC

## 2021-10-09 MED ORDER — FAMOTIDINE 40 MG PO TABS: ORAL_TABLET | ORAL | 3 refills | Status: DC

## 2021-10-09 MED ORDER — ESTRADIOL 0.1 MG/24HR TD PTTW: MEDICATED_PATCH | TRANSDERMAL | 3 refills | Status: DC

## 2021-10-09 MED ORDER — FLUTICASONE PROPIONATE 50 MCG/ACT NA SUSP: NASAL | 11 refills | Status: DC

## 2021-10-09 NOTE — Patient Instructions (Addendum)
Please start checking your blood pressure at home and keep a record of the measurements.  If your blood pressure is more than 135 on the top number or more 90 on the bottom number a lot of the time, then please let Dr Mercadez Heitman know.  ? ?If you had blood tests today, Dr Maziah Smola will send you a MyChart message or a letter if the results are normal.  Otherwise, he will give you a call.  ? ?You can learn about the Mediterranean Diet at [email or web address]   ? ? ? ? ? ?SHINGLES VACCINATION ? ?Chickenpox and shingles are related because they are caused by the same virus (varicella-zoster virus). After a person recovers from chickenpox, the virus stays dormant (inactive) in the body. It can reactivate years later and cause shingles. ? ?CDC recommends that adults 50 years and older get two doses of the shingles vaccine called Shingrix (recombinant zoster vaccine) to prevent shingles and the complications from the disease. ? ?Shingrix vaccination provides strong protection against shingles. In adults 50 years Shingrix is more than 90% effective at preventing shingles. ? ?Studies show that Shingrix is safe. The vaccine helps your body create a strong defense against shingles. As a result, you are likely to have temporary side effects from getting the shots. Some people feel they are having a mild flu without the head cold symptoms.  The side effects might affect your ability to do normal daily activities for 2 to 3 days.  Getting the shot when you have a couple days without commitments is a good idea, in case you do feel under the weather. ? ?Medicare prescription drug plans (Part D) usually cover all available vaccines needed to prevent illness, like the shingles (Shingrix) shot. Contact your Medicare drug plan If you are uncertain if they will cover the shingles vaccination.  ? ?Your pharmacist can give you Shingrix as a shot in your upper arm. ? ?

## 2021-10-10 ENCOUNTER — Other Ambulatory Visit: Payer: Self-pay | Admitting: Family Medicine

## 2021-10-10 DIAGNOSIS — Z1231 Encounter for screening mammogram for malignant neoplasm of breast: Secondary | ICD-10-CM

## 2021-10-10 LAB — LIPID PANEL
Chol/HDL Ratio: 3.1 ratio (ref 0.0–4.4)
Cholesterol, Total: 202 mg/dL — ABNORMAL HIGH (ref 100–199)
HDL: 65 mg/dL (ref >39)
LDL Chol Calc (NIH): 125 mg/dL — ABNORMAL HIGH (ref 0–99)
Triglycerides: 66 mg/dL (ref 0–149)
VLDL Cholesterol Cal: 12 mg/dL (ref 5–40)

## 2021-10-10 LAB — BASIC METABOLIC PANEL
BUN/Creatinine Ratio: 26 — ABNORMAL HIGH (ref 9–23)
BUN: 18 mg/dL (ref 6–24)
CO2: 24 mmol/L (ref 20–29)
Calcium: 9.5 mg/dL (ref 8.7–10.2)
Chloride: 100 mmol/L (ref 96–106)
Creatinine, Ser: 0.69 mg/dL (ref 0.57–1.00)
Glucose: 85 mg/dL (ref 70–99)
Potassium: 4 mmol/L (ref 3.5–5.2)
Sodium: 136 mmol/L (ref 134–144)
eGFR: 105 mL/min/{1.73_m2} (ref >59)

## 2021-10-13 ENCOUNTER — Encounter: Payer: Self-pay | Admitting: Family Medicine

## 2021-10-13 ENCOUNTER — Other Ambulatory Visit (HOSPITAL_COMMUNITY): Payer: Self-pay

## 2021-10-13 ENCOUNTER — Telehealth: Payer: Self-pay

## 2021-10-13 NOTE — Assessment & Plan Note (Signed)
Established problem ?Well Controlled and is at goal of rhinoconjunctivitis sxs control. ?No signs of complications, medication side effects, or red flags. ?Continue current astelin ophth drops prn and Flonase NS daily ? ?

## 2021-10-13 NOTE — Telephone Encounter (Signed)
A Prior Authorization was initiated for this patients ALBUTEROL SULFATE HFA through CoverMyMeds.  ? ?Key: [id number] ? ?

## 2021-10-13 NOTE — Progress Notes (Signed)
Sara Patrick is alone ?Sources of clinical information for visit is/are patient. ?Nursing assessment for this office visit was reviewed with the patient for accuracy and revision.  ? ? ? ?Previous Report(s) Reviewed: office notes  ? ?  10/09/2021  ?  2:09 PM  ?Depression screen PHQ 2/9  ?Decreased Interest 0  ?Down, Depressed, Hopeless 0  ?PHQ - 2 Score 0  ?Altered sleeping 0  ?Tired, decreased energy 1  ?Change in appetite 0  ?Feeling bad or failure about yourself  0  ?Trouble concentrating 0  ?Moving slowly or fidgety/restless 0  ?Suicidal thoughts 0  ?PHQ-9 Score 1  ?Difficult doing work/chores Not difficult at all  ? ?Flowsheet Row Office Visit from 10/09/2021 in [facility] Office Visit from 09/12/2020 in [facility]  ?Thoughts that you would be better off dead, or of hurting yourself in some way Not at all Not at all  ?PHQ-9 Total Score 1 0  ? ?  ?  ? ?  03/30/2014  ? 10:15 AM  ?Fall Risk   ?Falls in the past year? No  ?Risk for fall due to : History of fall(s)  ? ? ? ?  10/09/2021  ?  2:09 PM 09/12/2020  ?  8:43 AM 09/07/2019  ?  8:51 AM  ?PHQ9 SCORE ONLY  ?PHQ-9 Total Score 1 0 0  ? ? ?Adult vaccines due  ?Topic Date Due  ? TETANUS/TDAP  07/28/2028  ? ? ?Health Maintenance Due  ?Topic Date Due  ? Zoster Vaccines- Shingrix (1 of 2) Never done  ? COLONOSCOPY (Pts 45-33yrs Insurance coverage will need to be confirmed)  Never done  ? COVID-19 Vaccine (6 - Booster for Pfizer series) 07/07/2021  ?  ? ? ?History/P.E. limitations: none ? ?Adult vaccines due  ?Topic Date Due  ? TETANUS/TDAP  07/28/2028  ? There are no preventive care reminders to display for this patient.  ?Health Maintenance Due  ?Topic Date Due  ? Zoster Vaccines- Shingrix (1 of 2) Never done  ? COLONOSCOPY (Pts 45-33yrs Insurance coverage will need to be confirmed)  Never done  ? COVID-19 Vaccine (6 - Booster for Pfizer series) 07/07/2021  ?  ? ?Chief Complaint  ?Patient presents with  ? Annual Exam  ?  ? ?

## 2021-10-13 NOTE — Assessment & Plan Note (Signed)
Established problem ?Well Controlled and is at goal of no indigestion even with reduction from omeprezole 20 mg daily to famotidine 40 mg daily.Marland Kitchen ?No signs of complications, medication side effects, or red flags. ?Continue Femotidine 40 mg daily.  No plan to restart omeprazole.  ? ?

## 2021-10-13 NOTE — Telephone Encounter (Signed)
Disregard PA, medication ready for pickup at pharmacy. ?

## 2021-10-13 NOTE — Assessment & Plan Note (Signed)
Wt Readings from Last 3 Encounters:  ?10/09/21 167 lb 6.4 oz (75.9 kg)  ?09/12/20 170 lb 6.4 oz (77.3 kg)  ?09/07/19 178 lb (80.7 kg)  ?Established problem. ?Stable. Patient is not at goal BMI < 30% ?Remaining active and taking arobic exercise (home video work-outs) at least three times a week ? ? ?

## 2021-10-13 NOTE — Assessment & Plan Note (Signed)
Established problem ?Well Controlled and is at goal of not needing to use rescue albuterol in last 3 months. Marland Kitchen ?No signs of complications, medication side effects, or red flags. ?Continue current medications and other regiments. ? ?

## 2021-10-13 NOTE — Assessment & Plan Note (Signed)
Established problem. ?Stable. ?No signs of complications, medication side effects, or red flags. ?Continue current medications and other regiments. ?? ?Discussed option of trial off Vivelle after age 52 to assess need to control vasoactive symptoms.  ?

## 2021-11-14 DIAGNOSIS — Z1231 Encounter for screening mammogram for malignant neoplasm of breast: Secondary | ICD-10-CM

## 2021-11-21 ENCOUNTER — Ambulatory Visit
Admission: RE | Admit: 2021-11-21 | Discharge: 2021-11-21 | Disposition: A | Discharge status: Home / Self Care | Payer: Managed Care, Other (non HMO) | Source: Ambulatory Visit

## 2021-11-21 DIAGNOSIS — Z1231 Encounter for screening mammogram for malignant neoplasm of breast: Secondary | ICD-10-CM

## 2021-11-25 ENCOUNTER — Encounter: Payer: Self-pay | Admitting: *Deleted

## 2022-03-01 ENCOUNTER — Telehealth: Payer: Self-pay | Admitting: Student

## 2022-03-01 DIAGNOSIS — R110 Nausea: Secondary | ICD-10-CM

## 2022-03-01 MED ORDER — ONDANSETRON HCL 4 MG PO TABS: 4.0000 mg | ORAL_TABLET | Freq: Three times a day (TID) | ORAL | 0 refills | Status: AC | PRN

## 2022-03-01 NOTE — Telephone Encounter (Signed)
**  After Hours/ Emergency Line Call**  Received a call to report that ORTENCIA ASKARI is experiencing what she describes as symptoms of vertigo, had an episode of vertigo a couple of years ago that was very similar in presentation and received some medication in the ED, unsure what type.  Endorsing that the "room is spinning" and causing some nausea for the patient.  Denying any other symptoms at the present time.  Recommended that we continue with Zofran to assist with nausea and discussed that Antivert could possibly help, patient would prefer over the counter medications but was amenable to Zofran, prescription sent to pharmacy.  Red flags discussed with patient and she verbalized understanding.  Will forward to PCP and access to care provider in office on 03/02/22 as I scheduled her with a visit at [facility] for further assessment with detailed neuro exam, consideration of Antivert, +/- Epley's, and possibly referral to vestibular rehab. Instructed to call back or see Urgent Care/ER if symptoms worsen.   Alfredo Martinez, MD  PGY-2, [facility] 03/01/2022 4:19 PM

## 2022-03-02 NOTE — Telephone Encounter (Signed)
Reviewed and agree with plan documented by Dr Jena Gauss.

## 2022-03-03 ENCOUNTER — Ambulatory Visit: Payer: Managed Care, Other (non HMO) | Admitting: Student

## 2022-03-03 VITALS — BP 154/83 | HR 64 | Ht 61.0 in | Wt 163.6 lb

## 2022-03-03 DIAGNOSIS — R42 Dizziness and giddiness: Secondary | ICD-10-CM | POA: Diagnosis not present

## 2022-03-03 MED ORDER — MECLIZINE HCL 12.5 MG PO TABS: 12.5000 mg | ORAL_TABLET | Freq: Three times a day (TID) | ORAL | 0 refills | Status: AC

## 2022-03-03 MED ORDER — MECLIZINE HCL 12.5 MG PO TABS: 12.5000 mg | ORAL_TABLET | Freq: Three times a day (TID) | ORAL | 0 refills | Status: DC | PRN

## 2022-03-03 NOTE — Patient Instructions (Signed)
Vertigo Vertigo is the feeling that you or your surroundings are moving when they are not. This feeling can come and go at any time. Vertigo often goes away on its own. Vertigo can be dangerous if it occurs while you are doing something that could endanger yourself or others, such as driving or operating machinery. Your health care provider will do tests to try to determine the cause of your vertigo. Tests will also help your health care provider decide how best to treat your condition. Follow these instructions at home: Eating and drinking     Dehydration can make vertigo worse. Drink enough fluid to keep your urine pale yellow. Do not drink alcohol. Activity Return to your normal activities as told by your health care provider. Ask your health care provider what activities are safe for you. In the morning, first sit up on the side of the bed. When you feel okay, stand slowly while you hold onto something until you know that your balance is fine. Move slowly. Avoid sudden body or head movements or certain positions, as told by your health care provider. If you have trouble walking or keeping your balance, try using a cane for stability. If you feel dizzy or unstable, sit down right away. Avoid doing any tasks that would cause danger to you or others if vertigo occurs. Avoid bending down if you feel dizzy. Place items in your home so that they are easy for you to reach without bending or leaning over. Do not drive or use machinery if you feel dizzy. General instructions Take over-the-counter and prescription medicines only as told by your health care provider. Keep all follow-up visits. This is important. Contact a health care provider if: Your medicines do not relieve your vertigo or they make it worse. Your condition gets worse or you develop new symptoms. You have a fever. You develop nausea or vomiting, or if nausea gets worse. Your family or friends notice any behavioral changes. You  have numbness or a prickling and tingling sensation in part of your body. Get help right away if you: Are always dizzy or you faint. Develop severe headaches. Develop a stiff neck. Develop sensitivity to light. Have difficulty moving or speaking. Have weakness in your hands, arms, or legs. Have changes in your hearing or vision. These symptoms may represent a serious problem that is an emergency. Do not wait to see if the symptoms will go away. Get medical help right away. Call your local emergency services (911 in the [place] Do not drive yourself to the hospital. Summary Vertigo is the feeling that you or your surroundings are moving when they are not. Your health care provider will do tests to try to determine the cause of your vertigo. Follow instructions for home care. You may be told to avoid certain tasks, positions, or movements. Contact a health care provider if your medicines do not relieve your symptoms, or if you have a fever, nausea, vomiting, or changes in behavior. Get help right away if you have severe headaches or difficulty speaking, or you develop hearing or vision problems. This information is not intended to replace advice given to you by your health care provider. Make sure you discuss any questions you have with your health care provider. Document Revised: 05/08/2020 Document Reviewed: 05/08/2020 Elsevier Patient Education  2023 Elsevier Inc.  

## 2022-03-03 NOTE — Progress Notes (Signed)
vertigo   SUBJECTIVE:   CHIEF COMPLAINT / HPI:   51 year old female presents with vertigo. She reports she occasionally feels like the room is spinning. These episodes last less than a minute. Symptoms started 2 [place], immediately she looked up from her phone she notice everything was spinning in circle She has had associated nausea and vomitting Patient has taken Zofran every day and that has since helped with the nausea Patient report prior history of vertigo 3 years ago that was treated in ED with a medication she does not remember at this time  PERTINENT  PMH / PSH: Noncontributory  OBJECTIVE:   BP (!) 150/82   Pulse 64   Ht 5\' 1"  (1.549 m)   Wt 163 lb 9.6 oz (74.2 kg)   SpO2 100%   BMI 30.91 kg/m    Physical Exam General: Alert, well appearing, NAD Cardiovascular: RRR, No Murmurs, Normal S2/S2 Respiratory: CTAB, No wheezing or Rales Neuro: A&O X3, No focal neuro deficit  Dix Hallpike maneuver: Negative  ASSESSMENT/PLAN:   Vertigo 51 year old patient presenting with vertigo. Dix-hallpike maneuver was negative and reports mild improvement after Epley maneuver. On exam no focal neurologic deficit. History and exam are more consistent with BPPV vertigo is of unclear etiology at this time. Ddx include labyrinthitis, acoustic neuroma, vestibular neuronitis. Discussed conservative management and will send referral to neurology. -Performed the Epley maneuver -Rx meclizine 12.5 mg 3 times daily -Placed Neurology referral for vestibular rehab - Recommend continued use of Zofran as needed for nausea  43, MD [facility]

## 2022-03-10 ENCOUNTER — Ambulatory Visit: Payer: Managed Care, Other (non HMO) | Attending: Family Medicine

## 2022-03-10 ENCOUNTER — Encounter: Payer: Self-pay | Admitting: Family Medicine

## 2022-03-10 DIAGNOSIS — H832X2 Labyrinthine dysfunction, left ear: Secondary | ICD-10-CM

## 2022-03-10 DIAGNOSIS — R42 Dizziness and giddiness: Secondary | ICD-10-CM | POA: Insufficient documentation

## 2022-03-10 DIAGNOSIS — R262 Difficulty in walking, not elsewhere classified: Secondary | ICD-10-CM | POA: Insufficient documentation

## 2022-03-10 HISTORY — DX: Labyrinthine dysfunction, left ear: H83.2X2

## 2022-03-10 NOTE — Therapy (Signed)
OUTPATIENT PHYSICAL THERAPY VESTIBULAR EVALUATION     Patient Name: Sara Patrick MRN: [id number] DOB:02/02/1971, 51 y.o., female Today's Date: 03/10/2022  PCP: McDiarmid, Leighton Roach, MD  REFERRING PROVIDER: McDiarmid, Leighton Roach, MD    PT End of Session - 03/10/22 0759     Visit Number 1    Number of Visits 9    Date for PT Re-Evaluation 04/14/22   to allow for schedling delay   Authorization Type [employer] managed    PT Start Time 0800    PT Stop Time 0842    PT Time Calculation (min) 42 min    Activity Tolerance Patient tolerated treatment well    Behavior During Therapy [facility] for tasks assessed/performed             Past Medical History:  Diagnosis Date   Acute rhinosinusitis 05/22/2016   Ankle pain, right, chronic 02/17/2011   Asthma, intermittent    Breast mass, left 02/01/2017   07/2018 Mammogram: Benign left breast mass stable size over 2 years - return to annual mammographic screening  Probably benign left breast mass on 08/06/2017 - Follow up coincident surveillance and bilateral screening mammogram in one year.  Probably benign left breast mass is mammographically stable 01/2017 mammogram.  Follow up surveillance mammography in 6 months.    GASTROESOPHAGEAL REFLUX, NO ESOPHAGITIS 10/14/2007   History of ankle sprain, right 01/30/2010   History of endometriosis 08/19/2006   Myopic astigmatism    Overweight(278.02) 01/10/2009   Patellofemoral dysfunction of left knee 05/10/2015   PEPTIC ULCER DIS., UNSPEC. W/O OBSTRUCTION 10/14/2007   Qualifier: Diagnosis of  By: McDiarmid MD, Todd     Peptic ulcer disease    Perennial allergic rhinitis with seasonal variation 10/14/2007   Qualifier: Diagnosis of  By: McDiarmid MD, Todd     Plantar fasciitis of left foot 07/29/2018   RHINITIS, ALLERGIC 10/14/2007   Tympanic membrane perforation 02/2014   Right TM   Varicose vein of leg 03/01/2012   Past Surgical History:  Procedure Laterality Date   TOTAL ABDOMINAL HYSTERECTOMY W/ BILATERAL  SALPINGOOPHORECTOMY  12/2005   Hysterectomy: Fibroids &L.ovarian endometrriosis, adenomyosis (07/07, Dr Yolanda Bonine [OBGYN]) [   Patient Active Problem List   Diagnosis Date Noted   Toenail lesion 07/23/2017   Obesity 05/10/2015   Allergic conjunctivitis and rhinitis 10/14/2007   Mild intermittent asthma 10/14/2007   Chronic gastroesophageal reflux disease 10/14/2007   Menopausal and postmenopausal disorder 02/24/2007    ONSET DATE: 03/01/22  REFERRING DIAG: R42 (ICD-10-CM) - Vertigo   THERAPY DIAG:  Dizziness and giddiness  Difficulty in walking, not elsewhere classified  Rationale for Evaluation and Treatment Rehabilitation  SUBJECTIVE:   SUBJECTIVE STATEMENT: Patient reports doing much better compared to last week. Still reports increase in "room spinning" when turning head to the left. No N/V, vision changes, hearing changes. Started when walking on the beach looking down at her phone and when she looked up she felt dizzy.  Pt accompanied by: self  PERTINENT HISTORY: 1 prior episode of vertigo ~3 years ago   PAIN:  Are you having pain? No  PRECAUTIONS: Fall  WEIGHT BEARING RESTRICTIONS No  FALLS: Has patient fallen in last 6 months? 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)  PATIENT GOALS "Get my balance back"   OBJECTIVE:   COGNITION: Overall cognitive status: Within functional limits for  tasks assessed   SENSATION: WFL   POSTURE: No Significant postural limitations   Cervical ROM:    Active A/PROM (deg) eval  Flexion WFL  Extension WFL  Right lateral flexion WFL  Left lateral flexion WFL  Right rotation WFL  Left rotation WFL  (Blank rows = not tested)  LOWER EXTREMITY MMT:   MMT Right eval Left eval  Hip flexion [facility]  Hip abduction [facility]  Hip adduction WFL    Hip internal rotation    Hip external rotation    Knee flexion [facility] WFL  Knee extension [facility]  Ankle dorsiflexion [facility] WFL  Ankle plantarflexion [facility] WFL  Ankle inversion    Ankle eversion    (Blank rows = not tested)  BED MOBILITY:  Patient denies difficulty   TRANSFERS: Assistive device utilized: None  Sit to stand: Complete Independence Stand to sit: Complete Independence Chair to chair: Complete Independence  PATIENT SURVEYS:  FOTO 60   VESTIBULAR ASSESSMENT    SYMPTOM BEHAVIOR:   Subjective history: see above   Non-Vestibular symptoms:  N/A   Type of dizziness: Unsteady with head/body turns and "Funny feeling in the head"   Aggravating factors: Induced by motion: occur when walking, turning body quickly, turning head quickly, driving, and sitting in a moving car and constant   Relieving factors: closing eyes   Progression of symptoms: better   OCULOMOTOR EXAM:   Ocular Alignment: normal   Ocular ROM: No Limitations   Spontaneous Nystagmus: absent   Gaze-Induced Nystagmus: absent   Smooth Pursuits: intact   Saccades: hypermetric/overshoots with downward gaze   Convergence/Divergence: <6 cm    VESTIBULAR - OCULAR REFLEX:    Slow VOR: Normal   VOR Cancellation: Normal   Head-Impulse Test: HIT Left: positive   Dynamic Visual Acuity: Static: 5 Dynamic: 3    POSITIONAL TESTING: Right Dix-Hallpike: no nystagmus Left Dix-Hallpike: no nystagmus    MOTION SENSITIVITY:    Motion Sensitivity Quotient  Intensity: 0 = none, 1 = Lightheaded, 2 = Mild, 3 = Moderate, 4 = Severe, 5 = Vomiting  Intensity  1. Sitting to supine 0  2. Supine to L side 2  3. Supine to R side 0  4. Supine to sitting 0  5. L Hallpike-Dix 0  6. Up from L  0  7. R Hallpike-Dix 0  8. Up from R  0  9. Sitting, head  tipped to L knee 1  10. Head up from L  knee 2  11. Sitting, head  tipped to R knee 0  12. Head up from R  knee 0  13. Sitting head turns x5 0  14.Sitting head nods x5  2  15. In stance, 180  turn to L  0  16. In stance, 180  turn to R 2     FUNCTIONAL GAIT:   [facility] PT Assessment - 03/10/22 0001       Standardized Balance Assessment   Standardized Balance Assessment 10 meter walk test    10 Meter Walk 1.63m/s      Functional Gait  Assessment   Gait assessed  Yes    Gait Level Surface Walks 20 ft in less than 7 sec but greater than 5.5 sec, uses assistive device, slower speed, mild gait deviations, or deviates 6-10 in outside of the 12 in walkway width.    Change in Gait Speed Able to smoothly change walking speed without loss of balance or gait deviation. Deviate no more than 6  in outside of the 12 in walkway width.    Gait with Horizontal Head Turns Performs head turns smoothly with slight change in gait velocity (eg, minor disruption to smooth gait path), deviates 6-10 in outside 12 in walkway width, or uses an assistive device.    Gait with Vertical Head Turns Performs task with slight change in gait velocity (eg, minor disruption to smooth gait path), deviates 6 - 10 in outside 12 in walkway width or uses assistive device    Gait and Pivot Turn Pivot turns safely in greater than 3 sec and stops with no loss of balance, or pivot turns safely within 3 sec and stops with mild imbalance, requires small steps to catch balance.    Step Over Obstacle Is able to step over 2 stacked shoe boxes taped together (9 in total height) without changing gait speed. No evidence of imbalance.    Gait with Narrow Base of Support Is able to ambulate for 10 steps heel to toe with no staggering.    Gait with Eyes Closed Walks 20 ft, slow speed, abnormal gait pattern, evidence for imbalance, deviates 10-15 in outside 12 in walkway width. Requires more than 9 sec to ambulate 20 ft.    Ambulating Backwards Walks 20 ft, uses assistive device, slower speed, mild gait deviations, deviates 6-10 in outside 12 in walkway width.    Steps Alternating feet, no rail.    Total Score 23                VESTIBULAR TREATMENT: N/a eval   PATIENT EDUCATION: Education details: PT POC, outcome measure results, vertigo vs BPPV Person educated: Patient Education method: Explanation Education comprehension: verbalized understanding   GOALS: Goals reviewed with patient? Yes  SHORT TERM GOALS= LTG based on POC length   LONG TERM GOALS: Target date: 04/14/2022    Pt will be independent with final HEP for improved balance  Baseline: to be provided Goal status: INITIAL  2.  Pt will improve FGA to >/= 28/30 to demonstrate improved balance and reduced fall risk  Baseline: 23/30 Goal status: INITIAL  3.  Patient will improve time on condition 4 of M-CTSIB to >/=30s Baseline: 7S Goal status: INITIAL  4.  Pt will report </= 1/5 for all movements on MSQ to indicate improvement in motion sensitivity and improved activity tolerance.   Baseline: 2/5 Goal status: INITIAL  5. Patient will score >/= 66 on FOTO assessment to demonstrate reduction in symptoms  Baseline: 60  Goal status: INITIAL  ASSESSMENT:  CLINICAL IMPRESSION: Patient is a 51 y.o. female who was seen today for physical therapy evaluation and treatment for gait and balance impairment related to recent episode of vertigo. Patient does have positive L HIT indicating L hypofunction and residual functional impairments. Patient demonstrates increased fall risk as noted by score of 23/30 on  Functional Gait Assessment.   <22/30 = predictive of falls, <20/30 = fall in 6 months, <18/30 = predictive of falls in PD MCID: 5 points stroke population, 4 points geriatric population (ANPTA Core Set of Outcome Measures for Adults with Neurologic Conditions, 2018). 10 Meter Walk Test: Patient instructed to walk 10 meters (32.8 ft) as quickly and as safely as possible at their normal speed x2 and at a fast speed x2. Time measured from 2 meter mark to 8 meter mark to accommodate ramp-up and ramp-down.  Normal speed:  1.1683m/s Cut off scores: <0.4 m/s = household Ambulator, 0.4-0.8 m/s = limited community Ambulator, >0.8 m/s =  community Ambulator, >1.2 m/s = crossing a street, <1.0 = increased fall risk MCID 0.05 m/s (small), 0.13 m/s (moderate), 0.06 m/s (significant)  (ANPTA Core Set of Outcome Measures for Adults with Neurologic Conditions, 2018). Patient scoring within 2 lines on DVA, but results likely skewed as patient did not have her eye glasses at the time. Patient scoring with come components of MSQ at 2/5 indicating mild dizziness. She would benefit from skilled PT services to address the above mentioned deficits.   OBJECTIVE IMPAIRMENTS Abnormal gait, decreased balance, decreased knowledge of condition, difficulty walking, and dizziness.   ACTIVITY LIMITATIONS lifting, bending, standing, squatting, locomotion level, and caring for others  PARTICIPATION LIMITATIONS: cleaning, driving, shopping, community activity, and occupation  PERSONAL FACTORS Past/current experiences and 1 comorbidity: 1 prior episode of vertigo  are also affecting patient's functional outcome.   REHAB POTENTIAL: Good  CLINICAL DECISION MAKING: Evolving/moderate complexity  EVALUATION COMPLEXITY: Moderate   PLAN: PT FREQUENCY: 2x/week  PT DURATION: 4 weeks  PLANNED INTERVENTIONS: Therapeutic exercises, Therapeutic activity, Neuromuscular re-education, Balance training, Gait training, Patient/Family education, Self Care, Joint mobilization, Stair training, Vestibular training, Canalith repositioning, Visual/preceptual remediation/compensation, DME instructions, Manual therapy, and Re-evaluation  PLAN FOR NEXT SESSION: SOT?, retest DVA with glasses, HEP   Debbora Dus, PT Debbora Dus, PT, DPT, CBIS  03/10/2022, 8:43 AM

## 2022-03-12 ENCOUNTER — Ambulatory Visit: Payer: Managed Care, Other (non HMO)

## 2022-03-12 DIAGNOSIS — R42 Dizziness and giddiness: Secondary | ICD-10-CM | POA: Diagnosis not present

## 2022-03-12 DIAGNOSIS — R262 Difficulty in walking, not elsewhere classified: Secondary | ICD-10-CM

## 2022-03-12 NOTE — Therapy (Signed)
OUTPATIENT PHYSICAL THERAPY VESTIBULAR TREATMENT NOTE   Patient Name: Sara Patrick MRN: [id number] DOB:1970-07-24, 51 y.o., female Today's Date: 03/12/2022  PCP: McDiarmid, Blane Ohara, MD  REFERRING PROVIDER: McDiarmid, Blane Ohara, MD    PT End of Session - 03/12/22 1221     Visit Number 2    Number of Visits 9    Date for PT Re-Evaluation 04/14/22    Authorization Type [employer] managed    PT Start Time 1230    PT Stop Time 1315    PT Time Calculation (min) 45 min    Activity Tolerance Patient tolerated treatment well    Behavior During Therapy WFL for tasks assessed/performed             Past Medical History:  Diagnosis Date   Acute rhinosinusitis 05/22/2016   Ankle pain, right, chronic 02/17/2011   Asthma, intermittent    Breast mass, left 02/01/2017   07/2018 Mammogram: Benign left breast mass stable size over 2 years - return to annual mammographic screening  Probably benign left breast mass on 08/06/2017 - Follow up coincident surveillance and bilateral screening mammogram in one year.  Probably benign left breast mass is mammographically stable 01/2017 mammogram.  Follow up surveillance mammography in 6 months.    GASTROESOPHAGEAL REFLUX, NO ESOPHAGITIS 10/14/2007   History of ankle sprain, right 01/30/2010   History of endometriosis 08/19/2006   Myopic astigmatism    Overweight(278.02) 01/10/2009   Patellofemoral dysfunction of left knee 05/10/2015   PEPTIC ULCER DIS., UNSPEC. W/O OBSTRUCTION 10/14/2007   Qualifier: Diagnosis of  By: McDiarmid MD, Todd     Peptic ulcer disease    Perennial allergic rhinitis with seasonal variation 10/14/2007   Qualifier: Diagnosis of  By: McDiarmid MD, Todd     Plantar fasciitis of left foot 07/29/2018   RHINITIS, ALLERGIC 10/14/2007   Tympanic membrane perforation 02/2014   Right TM   Varicose vein of leg 03/01/2012   Past Surgical History:  Procedure Laterality Date   TOTAL ABDOMINAL HYSTERECTOMY W/ BILATERAL SALPINGOOPHORECTOMY  12/2005    Hysterectomy: Fibroids &L.ovarian endometrriosis, adenomyosis (07/07, Dr Erskin Burnet [OBGYN]) [   Patient Active Problem List   Diagnosis Date Noted   Vestibular hypofunction, left 03/10/2022   Toenail lesion 07/23/2017   Obesity 05/10/2015   Allergic conjunctivitis and rhinitis 10/14/2007   Mild intermittent asthma 10/14/2007   Chronic gastroesophageal reflux disease 10/14/2007   Menopausal and postmenopausal disorder 02/24/2007    ONSET DATE: 03/01/22   REFERRING DIAG: R42 (ICD-10-CM) - Vertigo   THERAPY DIAG:  Dizziness and giddiness  Difficulty in walking, not elsewhere classified  Rationale for Evaluation and Treatment Rehabilitation  PERTINENT HISTORY: 1 prior episode of vertigo ~3 years ago  PRECAUTIONS: fall  SUBJECTIVE: Patient reports doing well. Went to work after eval with some "wobbliness." No falls/near falls.   PAIN:  Are you having pain? No    OBJECTIVE:   POSITIONAL TESTS:  Right Dix-Hallpike: no nystagmus Left Dix-Hallpike: no nystagmus   DVA: Static line 10; dynamic line 9   Neuro re-ed: sensory organization test performed with following results: Conditions: 1: 3/3 2: 2/3 3: 1/3  4: 1/3 5: 3/3 6: 3/3 Composite score: 74 Sensory Analysis Som: PASS Vis: PASS Vest: PASS Pref: PASS Strategy analysis: hip> ankle       COG alignment: slightly posterior         VESTIBULAR TREATMENT:  Gaze Adaptation: x1 Viewing Horizontal: Position: standing and Time: 30s, x1 Viewing Vertical:  Position: standing and  Time: 30s, x2 Viewing Horizontal:  Position: standing and Time: 30s, and x2 Viewing Vertical:  Position: standing and Time: 30s  Habituation: Bending/Reaching to the Floor: number of reps: 2x4 picking up cones  PATIENT EDUCATION: Education details: outcome measure results, HEP Person educated: Patient Education method: Explanation Education comprehension: verbalized understanding  HOME EXERCISE PROGRAM Gaze Stabilization:  Sitting    Keeping eyes on target on wall \\_10N  feet away, tilt head down 15-30 and move head side to side for __30__ seconds. Repeat while moving head up and down for __30__ seconds. Do __3__ sessions per day. Repeat using target on pattern background.  Bending / Picking Up Objects    Sitting, slowly bend head down and pick up object on the floor. Return to upright position. Hold position until symptoms subside. Repeat _2x4___ times per session. Do _3___ sessions per day.  Access Code: [email or web address] URL: [email or web address] Date: 03/12/2022 Prepared by: Merry Lofty  Exercises - Seated Gaze Stabilization with Head Nod and Vertical Arm Movement  - 1 x daily - 7 x weekly - 3 sets - 30 hold - Seated Gaze Stabilization with Head Rotation and Horizontal Arm Movement  - 1 x daily - 7 x weekly - 3 sets - 30 hold   GOALS: Goals reviewed with patient? Yes   SHORT TERM GOALS= LTG based on POC length    LONG TERM GOALS: Target date: 04/14/2022     Pt will be independent with final HEP for improved balance  Baseline: to be provided Goal status: INITIAL   2.  Pt will improve FGA to >/= 28/30 to demonstrate improved balance and reduced fall risk   Baseline: 23/30 Goal status: INITIAL   3.  Patient will improve time on condition 4 of M-CTSIB to >/=30s Baseline: 7S Goal status: INITIAL   4.  Pt will report </= 1/5 for all movements on MSQ to indicate improvement in motion sensitivity and improved activity tolerance.    Baseline: 2/5 Goal status: INITIAL   5. Patient will score >/= 66 on FOTO assessment to demonstrate reduction in symptoms            Baseline: 60            Goal status: INITIAL   ASSESSMENT:   CLINICAL IMPRESSION: Patient seen for skilled PT session with emphasis on vestibular retraining. Patient scoring WNL on DVA with eyeglasses. She does demonstrate some difficulty with eyes closed and on compliant surfaces and would benefit from continued work  on this. Continue POC.    OBJECTIVE IMPAIRMENTS Abnormal gait, decreased balance, decreased knowledge of condition, difficulty walking, and dizziness.    ACTIVITY LIMITATIONS lifting, bending, standing, squatting, locomotion level, and caring for others   PARTICIPATION LIMITATIONS: cleaning, driving, shopping, community activity, and occupation   PERSONAL FACTORS Past/current experiences and 1 comorbidity: 1 prior episode of vertigo  are also affecting patient's functional outcome.    REHAB POTENTIAL: Good   CLINICAL DECISION MAKING: Evolving/moderate complexity   EVALUATION COMPLEXITY: Moderate     PLAN: PT FREQUENCY: 2x/week   PT DURATION: 4 weeks   PLANNED INTERVENTIONS: Therapeutic exercises, Therapeutic activity, Neuromuscular re-education, Balance training, Gait training, Patient/Family education, Self Care, Joint mobilization, Stair training, Vestibular training, Canalith repositioning, Visual/preceptual remediation/compensation, DME instructions, Manual therapy, and Re-evaluation   PLAN FOR NEXT SESSION: eyes closed, compliant surfaces, habituation    Westley Foots, PT Westley Foots, PT, DPT, CBIS  03/12/2022, 1:39 PM

## 2022-03-16 ENCOUNTER — Encounter: Payer: Self-pay | Admitting: Physical Therapy

## 2022-03-16 ENCOUNTER — Ambulatory Visit: Payer: Managed Care, Other (non HMO) | Admitting: Physical Therapy

## 2022-03-16 DIAGNOSIS — R42 Dizziness and giddiness: Secondary | ICD-10-CM | POA: Diagnosis not present

## 2022-03-16 NOTE — Therapy (Signed)
OUTPATIENT PHYSICAL THERAPY VESTIBULAR TREATMENT NOTE   Patient Name: Sara Patrick MRN: [id number] DOB:September 09, 1970, 51 y.o., female Today's Date: 03/16/2022  PCP: McDiarmid, Blane Ohara, MD  REFERRING PROVIDER: McDiarmid, Blane Ohara, MD    PT End of Session - 03/16/22 1829     Visit Number 3    Number of Visits 9    Date for PT Re-Evaluation 04/14/22    Authorization Type [employer] managed    PT Start Time 1020    PT Stop Time 1104    PT Time Calculation (min) 44 min    Activity Tolerance Patient tolerated treatment well    Behavior During Therapy WFL for tasks assessed/performed              Past Medical History:  Diagnosis Date   Acute rhinosinusitis 05/22/2016   Ankle pain, right, chronic 02/17/2011   Asthma, intermittent    Breast mass, left 02/01/2017   07/2018 Mammogram: Benign left breast mass stable size over 2 years - return to annual mammographic screening  Probably benign left breast mass on 08/06/2017 - Follow up coincident surveillance and bilateral screening mammogram in one year.  Probably benign left breast mass is mammographically stable 01/2017 mammogram.  Follow up surveillance mammography in 6 months.    GASTROESOPHAGEAL REFLUX, NO ESOPHAGITIS 10/14/2007   History of ankle sprain, right 01/30/2010   History of endometriosis 08/19/2006   Myopic astigmatism    Overweight(278.02) 01/10/2009   Patellofemoral dysfunction of left knee 05/10/2015   PEPTIC ULCER DIS., UNSPEC. W/O OBSTRUCTION 10/14/2007   Qualifier: Diagnosis of  By: McDiarmid MD, Todd     Peptic ulcer disease    Perennial allergic rhinitis with seasonal variation 10/14/2007   Qualifier: Diagnosis of  By: McDiarmid MD, Todd     Plantar fasciitis of left foot 07/29/2018   RHINITIS, ALLERGIC 10/14/2007   Tympanic membrane perforation 02/2014   Right TM   Varicose vein of leg 03/01/2012   Past Surgical History:  Procedure Laterality Date   TOTAL ABDOMINAL HYSTERECTOMY W/ BILATERAL SALPINGOOPHORECTOMY  12/2005    Hysterectomy: Fibroids &L.ovarian endometrriosis, adenomyosis (07/07, Dr Erskin Burnet [OBGYN]) [   Patient Active Problem List   Diagnosis Date Noted   Vestibular hypofunction, left 03/10/2022   Toenail lesion 07/23/2017   Obesity 05/10/2015   Allergic conjunctivitis and rhinitis 10/14/2007   Mild intermittent asthma 10/14/2007   Chronic gastroesophageal reflux disease 10/14/2007   Menopausal and postmenopausal disorder 02/24/2007    ONSET DATE: 03/01/22   REFERRING DIAG: R42 (ICD-10-CM) - Vertigo   THERAPY DIAG:  Dizziness and giddiness  Rationale for Evaluation and Treatment Rehabilitation  PERTINENT HISTORY: 1 prior episode of vertigo ~3 years ago  PRECAUTIONS: fall  SUBJECTIVE: Patient reports she is doing better than she was last week; did her exercises at home this weekend and did have a little dizziness with doing letter exercise, especially with looking down but states it did not last long  PAIN:  Are you having pain? No    OBJECTIVE:   VESTIBULAR TREATMENT:  Gaze Adaptation: X1 viewing - standing - plain background - 60 secs horizontal head turns;  30 secs patterned background in standing position x1 rep X1 viewing - standing - plain background - 60 secs vertical head turns:  30 secs patterned background x 1 rep in standing position- 1 rep  Pt states she is currently using TV as patterned background   Standing Balance: Surface: Pillows Position: Narrow Base of Support Feet Hip Width Apart Completed with: Eyes  Open and Eyes Closed; Head Turns x 5 Reps and Head Nods x 5 Reps - with EO and with EC - feet part and feet together   Pt stood on 2 pillows in corner - performed trunk rotation EO side to side 5 reps; then with EC 5 reps; progressed to diagonal pattern "X" with EO and then EC 5 reps each direction; pt had no LOB and no c/o dizziness with this activity     PATIENT EDUCATION: Education details: Progressed x1 viewing to STANDING position, patterned  background for 60 secs; instructed pt to try standing on pillow/foam if exercise became easy in standing;  discontinued bending over and returning to upright;  added standing on pillows with feet together with EO and EC with head turns; discussed etiology of BPPV - article on BPPV given to pt from [employer];  instructed pt to drink water throughout day rather than increased intake earlier in day and then minimal during day as she states she is currently doing  Person educated: Patient Education method: Explanation, demonstration, handout from [employer] Education comprehension: verbalized understanding    HOME EXERCISE PROGRAM Gaze Stabilization: Sitting - progressed to standing position on 03-16-22    Keeping eyes on target on wall \\_10N  feet away, tilt head down 15-30 and move head side to side for __30__ seconds. Repeat while moving head up and down for __30__ seconds. Do __3__ sessions per day. Repeat using target on pattern background.  Bending / Picking Up Objects    Sitting, slowly bend head down and pick up object on the floor. Return to upright position. Hold position until symptoms subside. Repeat _2x4___ times per session. Do _3___ sessions per day.  Access Code: [email or web address] URL: [email or web address] Date: 03/12/2022 Prepared by: Merry Lofty  Exercises - Seated Gaze Stabilization with Head Nod and Vertical Arm Movement  - 1 x daily - 7 x weekly - 3 sets - 30 hold - Seated Gaze Stabilization with Head Rotation and Horizontal Arm Movement  - 1 x daily - 7 x weekly - 3 sets - 30 hold   GOALS: Goals reviewed with patient? Yes   SHORT TERM GOALS= LTG based on POC length    LONG TERM GOALS: Target date: 04/14/2022     Pt will be independent with final HEP for improved balance  Baseline: to be provided Goal status: INITIAL   2.  Pt will improve FGA to >/= 28/30 to demonstrate improved balance and reduced fall risk   Baseline: 23/30 Goal status: INITIAL   3.   Patient will improve time on condition 4 of M-CTSIB to >/=30s Baseline: 7S Goal status: INITIAL   4.  Pt will report </= 1/5 for all movements on MSQ to indicate improvement in motion sensitivity and improved activity tolerance.    Baseline: 2/5 Goal status: INITIAL   5. Patient will score >/= 66 on FOTO assessment to demonstrate reduction in symptoms            Baseline: 60            Goal status: INITIAL   ASSESSMENT:   CLINICAL IMPRESSION: PT session focused on vestibular exercises and gaze stabilization exercises in standing with use of patterned background for increased challenge. Pt reporting improvement in dizziness since last week - symptoms are consistent with episodic BPPV with residual mild vestibular hypofunction upon resolution.  Pt to progress HEP this week - may D/C if vertigo remains resolved.  Continue POC.    OBJECTIVE IMPAIRMENTS Abnormal gait, decreased  balance, decreased knowledge of condition, difficulty walking, and dizziness.    ACTIVITY LIMITATIONS lifting, bending, standing, squatting, locomotion level, and caring for others   PARTICIPATION LIMITATIONS: cleaning, driving, shopping, community activity, and occupation   PERSONAL FACTORS Past/current experiences and 1 comorbidity: 1 prior episode of vertigo  are also affecting patient's functional outcome.    REHAB POTENTIAL: Good   CLINICAL DECISION MAKING: Evolving/moderate complexity   EVALUATION COMPLEXITY: Moderate     PLAN: PT FREQUENCY: 2x/week   PT DURATION: 4 weeks   PLANNED INTERVENTIONS: Therapeutic exercises, Therapeutic activity, Neuromuscular re-education, Balance training, Gait training, Patient/Family education, Self Care, Joint mobilization, Stair training, Vestibular training, Canalith repositioning, Visual/preceptual remediation/compensation, DME instructions, Manual therapy, and Re-evaluation   PLAN FOR NEXT SESSION:  check updated HEP - D/C if vertigo remains resolved    Kary Kos, PT  03/16/2022, 6:31 PM

## 2022-03-19 ENCOUNTER — Ambulatory Visit: Payer: Managed Care, Other (non HMO)

## 2022-03-24 ENCOUNTER — Ambulatory Visit: Payer: Managed Care, Other (non HMO) | Attending: Family Medicine | Admitting: Physical Therapy

## 2022-03-24 ENCOUNTER — Encounter: Payer: Self-pay | Admitting: Physical Therapy

## 2022-03-24 VITALS — BP 128/82

## 2022-03-24 DIAGNOSIS — R262 Difficulty in walking, not elsewhere classified: Secondary | ICD-10-CM | POA: Diagnosis present

## 2022-03-24 DIAGNOSIS — R42 Dizziness and giddiness: Secondary | ICD-10-CM

## 2022-03-24 NOTE — Therapy (Signed)
OUTPATIENT PHYSICAL THERAPY VESTIBULAR TREATMENT NOTE   Patient Name: Sara Patrick MRN: [id number] DOB:[birth year], 51 y.o., female Today's Date: 03/24/2022  PCP: McDiarmid, Leighton Roach, MD  REFERRING PROVIDER: McDiarmid, Leighton Roach, MD    PT End of Session - 03/24/22 1226     Visit Number 4    Number of Visits 9    Date for PT Re-Evaluation 04/14/22    Authorization Type [employer] managed    PT Start Time 0847    PT Stop Time 0935    PT Time Calculation (min) 48 min    Activity Tolerance Patient tolerated treatment well    Behavior During Therapy [facility] for tasks assessed/performed               Past Medical History:  Diagnosis Date   Acute rhinosinusitis 05/22/2016   Ankle pain, right, chronic 02/17/2011   Asthma, intermittent    Breast mass, left 02/01/2017   07/2018 Mammogram: Benign left breast mass stable size over 2 years - return to annual mammographic screening  Probably benign left breast mass on 08/06/2017 - Follow up coincident surveillance and bilateral screening mammogram in one year.  Probably benign left breast mass is mammographically stable 01/2017 mammogram.  Follow up surveillance mammography in 6 months.    GASTROESOPHAGEAL REFLUX, NO ESOPHAGITIS 10/14/2007   History of ankle sprain, right 01/30/2010   History of endometriosis 08/19/2006   Myopic astigmatism    Overweight(278.02) 01/10/2009   Patellofemoral dysfunction of left knee 05/10/2015   PEPTIC ULCER DIS., UNSPEC. W/O OBSTRUCTION 10/14/2007   Qualifier: Diagnosis of  By: McDiarmid MD, Todd     Peptic ulcer disease    Perennial allergic rhinitis with seasonal variation 10/14/2007   Qualifier: Diagnosis of  By: McDiarmid MD, Todd     Plantar fasciitis of left foot 07/29/2018   RHINITIS, ALLERGIC 10/14/2007   Tympanic membrane perforation 02/2014   Right TM   Varicose vein of leg 03/01/2012   Past Surgical History:  Procedure Laterality Date   TOTAL ABDOMINAL HYSTERECTOMY W/ BILATERAL SALPINGOOPHORECTOMY  12/2005    Hysterectomy: Fibroids &L.ovarian endometrriosis, adenomyosis (07/07, Dr Yolanda Bonine [OBGYN]) [   Patient Active Problem List   Diagnosis Date Noted   Vestibular hypofunction, left 03/10/2022   Toenail lesion 07/23/2017   Obesity 05/10/2015   Allergic conjunctivitis and rhinitis 10/14/2007   Mild intermittent asthma 10/14/2007   Chronic gastroesophageal reflux disease 10/14/2007   Menopausal and postmenopausal disorder 02/24/2007    ONSET DATE: 03/01/22   REFERRING DIAG: R42 (ICD-10-CM) - Vertigo   THERAPY DIAG:  Dizziness and giddiness  Difficulty in walking, not elsewhere classified  Rationale for Evaluation and Treatment Rehabilitation  PERTINENT HISTORY: 1 prior episode of vertigo ~3 years ago  PRECAUTIONS: fall  SUBJECTIVE: Patient reports she had episode of dizziness last Thursday evening - was looking down at phone sitting on sofa and vertigo occurred- fell asleep on sofa but was unsteady and somewhat light-headed when she woke up - did not go to work on Friday; is working today but did not drive to PT due to not feeling steady  PAIN:  Are you having pain? No  Vitals:   03/24/22 0921  BP: 128/82     OBJECTIVE:   VESTIBULAR TREATMENT: Rt Dix-Hallpike (-) with no nystagmus and no c/o vertigo Lt Dix-Hallpike (+) with very low intensity and long lasting (approx. 20 secs) Lt rotary upbeating nystagmus with c/o dizziness but pt did not describe as room spinning vertigo  Epley maneuver 3 reps  for Lt BPPV posterior canalithiasis - no nystagmus and no c/o dizziness on 3rd rep  Habituation- pt performed sit to Lt sidelying 5 reps - pt initially c/o moderate dizziness with return to upright sitting from Lt sidelying on reps 1 and 2; mild dizziness on rep #3 & 4 and no dizziness on 5th rep;  pt also c/o dizziness with return to upright sitting from Rt sidelying   Standing Balance:   Pt stood on pillows with feet together with EO 30 secs:  with feet together with EC 7.8  secs on 1st trial and then 30 secs on 2nd trial with mild postural sway - issued standing on pillows with feet together with EC for HEP Surface: Pillows Position: Narrow Base of Support Feet Hip Width Apart Completed with: Eyes Open and Eyes Closed; Head Turns x 5 Reps and Head Nods x 5 Reps - with EO and with EC - feet part and feet together   Pt amb. 30' x 2 reps with horizontal head turns (less dizziness reported on 2nd rep than on 1st); 30' x 2 reps with vertical head turns - less dizziness on 2nd rep than on 1st rep     PATIENT EDUCATION: Education details: HEP updated - standing on pillows with EC feet together and add head turns if able; habituation - sit to Lt sidelying 5 reps; pt also educated in etiology of BPPV with handout from [employer] given: Epley maneuver for self-treatment prn Person educated: Patient Education method: Explanation, demonstration, handout from [employer] Education comprehension: verbalized understanding, demonstrated understanding    HOME EXERCISE PROGRAM  Sit to Side-Lying    Sit on edge of bed. 1. Turn head 45 to right. 2. Maintain head position and lie down slowly on left side. Hold until symptoms subside. 3. Sit up slowly. Hold until symptoms subside. 4. Turn head 45 to left. 5. Maintain head position and lie down slowly on right side. Hold until symptoms subside. 6. Sit up slowly. Repeat sequence _5___ times per session. Do _2-3___ sessions per day.      How to Perform the Epley Maneuver The Epley maneuver is an exercise that relieves symptoms of vertigo. Vertigo is the feeling that you or your surroundings are moving when they are not. When you feel vertigo, you may feel like the room is spinning and may have trouble walking. The Epley maneuver is used for a type of vertigo caused by a calcium deposit in a part of the inner ear. The maneuver involves changing head positions to help the deposit move out of the area. You can do this maneuver at home whenever  you have symptoms of vertigo. You can repeat it in 24 hours if your vertigo has not gone away. Even though the Epley maneuver may relieve your vertigo for a few weeks, it is possible that your symptoms will return. This maneuver relieves vertigo, but it does not relieve dizziness. What are the risks? If it is done correctly, the Epley maneuver is considered safe. Sometimes it can lead to dizziness or nausea that goes away after a short time. If you develop other symptoms--such as changes in vision, weakness, or numbness--stop doing the maneuver and call your health care provider. Supplies needed: A bed or table. A pillow. How to do the Epley maneuver     Sit on the edge of a bed or table with your back straight and your legs extended or hanging over the edge of the bed or table. Turn your head halfway  toward the affected ear or side as told by your health care provider. Lie backward quickly with your head turned until you are lying flat on your back. Your head should dangle (head-hanging position). You may want to position a pillow under your shoulders. Hold this position for at least 30 seconds. If you feel dizzy or have symptoms of vertigo, continue to hold the position until the symptoms stop. Turn your head to the opposite direction until your unaffected ear is facing down. Your head should continue to dangle. Hold this position for at least 30 seconds. If you feel dizzy or have symptoms of vertigo, continue to hold the position until the symptoms stop. Turn your whole body to the same side as your head so that you are positioned on your side. Your head will now be nearly facedown and no longer needs to dangle. Hold for at least 30 seconds. If you feel dizzy or have symptoms of vertigo, continue to hold the position until the symptoms stop. Sit back up. You can repeat the maneuver in 24 hours if your vertigo does not go away. Follow these instructions at home: For 24 hours after doing the  Epley maneuver: Keep your head in an upright position. When lying down to sleep or rest, keep your head raised (elevated) with two or more pillows. Avoid excessive neck movements. Activity Do not drive or use machinery if you feel dizzy. After doing the Epley maneuver, return to your normal activities as told by your health care provider. Ask your health care provider what activities are safe for you. General instructions Drink enough fluid to keep your urine pale yellow. Do not drink alcohol. Take over-the-counter and prescription medicines only as told by your health care provider. Keep all follow-up visits. This is important. Preventing vertigo symptoms Ask your health care provider if there is anything you should do at home to prevent vertigo. He or she may recommend that you: Keep your head elevated with two or more pillows while you sleep. Do not sleep on the side of your affected ear. Get up slowly from bed. Avoid sudden movements during the day. Avoid extreme head positions or movement, such as looking up or bending over. Contact a health care provider if: Your vertigo gets worse. You have other symptoms, including: Nausea. Vomiting. Headache. Get help right away if you: Have vision changes. Have a headache or neck pain that is severe or getting worse. Cannot stop vomiting. Have new numbness or weakness in any part of your body. These symptoms may represent a serious problem that is an emergency. Do not wait to see if the symptoms will go away. Get medical help right away. Call your local emergency services (911 in the [place] Do not drive yourself to the hospital. Summary Vertigo is the feeling that you or your surroundings are moving when they are not. The Epley maneuver is an exercise that relieves symptoms of vertigo. If the Epley maneuver is done correctly, it is considered safe. This information is not intended to replace advice given to you by your health care  provider. Make sure you discuss any questions you have with your health care provider. Document Revised: 05/08/2020 Document Reviewed: 05/08/2020 Elsevier Patient Education  [place] for Left Posterior / Anterior Canalithiasis    Sitting on bed: 1. Turn head 45 left. (a) Lie back slowly, shoulders on pillow, head on bed. (b) Hold _20___ seconds. 2. Keeping head on bed, turn head 90  right. Hold _20___ seconds. 3. Roll to right, head on 45 angle down toward bed. Hold ____ seconds. 4. Sit up on right side of bed. Repeat ___3_ times per session. Do __2__ sessions per day.   Feet Apart (Compliant Surface) Head Motion - Eyes Closed    Stand on compliant surface: ____pillows____ with feet shoulder width apart. Close eyes and move head slowly, up and down. Repeat __1__ times per session. Do _1-2___ sessions per day.  Copyright  [employer]. All rights reserved.      GOALS: Goals reviewed with patient? Yes   SHORT TERM GOALS= LTG based on POC length    LONG TERM GOALS: Target date: 04/14/2022     Pt will be independent with final HEP for improved balance  Baseline: to be provided Goal status: INITIAL   2.  Pt will improve FGA to >/= 28/30 to demonstrate improved balance and reduced fall risk   Baseline: 23/30 Goal status: INITIAL   3.  Patient will improve time on condition 4 of M-CTSIB to >/=30s Baseline: 7S Goal status: INITIAL   4.  Pt will report </= 1/5 for all movements on MSQ to indicate improvement in motion sensitivity and improved activity tolerance.    Baseline: 2/5 Goal status: INITIAL   5. Patient will score >/= 66 on FOTO assessment to demonstrate reduction in symptoms            Baseline: 60            Goal status: INITIAL   ASSESSMENT:   CLINICAL IMPRESSION: Pt had (+) Lt Dix-Hallpike test with mild intensity long duration nystagmus - (Lt rotary upbeating) indicative of Lt posterior canalithiasis.  Symptoms appeared to have  resolved on 3rd rep of Epley maneuver.  Symptoms also improved with sidelying to sitting transfer with no c/o dizziness on 5th rep of this movement.  Pt able to stand for 30 secs on 2nd rep on pillows with EC with feet together, indicative of increased vestibular input (7 secs at time of eval).  Continue with POC.    OBJECTIVE IMPAIRMENTS Abnormal gait, decreased balance, decreased knowledge of condition, difficulty walking, and dizziness.    ACTIVITY LIMITATIONS lifting, bending, standing, squatting, locomotion level, and caring for others   PARTICIPATION LIMITATIONS: cleaning, driving, shopping, community activity, and occupation   PERSONAL FACTORS Past/current experiences and 1 comorbidity: 1 prior episode of vertigo  are also affecting patient's functional outcome.    REHAB POTENTIAL: Good   CLINICAL DECISION MAKING: Evolving/moderate complexity   EVALUATION COMPLEXITY: Moderate     PLAN: PT FREQUENCY: 2x/week   PT DURATION: 4 weeks   PLANNED INTERVENTIONS: Therapeutic exercises, Therapeutic activity, Neuromuscular re-education, Balance training, Gait training, Patient/Family education, Self Care, Joint mobilization, Stair training, Vestibular training, Canalith repositioning, Visual/preceptual remediation/compensation, DME instructions, Manual therapy, and Re-evaluation   PLAN FOR NEXT SESSION:   recheck Lt BPPV and treat prn; balance/vestibular exercises prn    Sara Patrick, Donavan Burnet, PT  03/24/2022, 12:29 PM

## 2022-03-24 NOTE — Patient Instructions (Addendum)
Sit to Side-Lying    Sit on edge of bed. 1. Turn head 45 to right. 2. Maintain head position and lie down slowly on left side. Hold until symptoms subside. 3. Sit up slowly. Hold until symptoms subside. 4. Turn head 45 to left. 5. Maintain head position and lie down slowly on right side. Hold until symptoms subside. 6. Sit up slowly. Repeat sequence _5___ times per session. Do _2-3___ sessions per day.      How to Perform the Epley Maneuver The Epley maneuver is an exercise that relieves symptoms of vertigo. Vertigo is the feeling that you or your surroundings are moving when they are not. When you feel vertigo, you may feel like the room is spinning and may have trouble walking. The Epley maneuver is used for a type of vertigo caused by a calcium deposit in a part of the inner ear. The maneuver involves changing head positions to help the deposit move out of the area. You can do this maneuver at home whenever you have symptoms of vertigo. You can repeat it in 24 hours if your vertigo has not gone away. Even though the Epley maneuver may relieve your vertigo for a few weeks, it is possible that your symptoms will return. This maneuver relieves vertigo, but it does not relieve dizziness. What are the risks? If it is done correctly, the Epley maneuver is considered safe. Sometimes it can lead to dizziness or nausea that goes away after a short time. If you develop other symptoms--such as changes in vision, weakness, or numbness--stop doing the maneuver and call your health care provider. Supplies needed: A bed or table. A pillow. How to do the Epley maneuver     Sit on the edge of a bed or table with your back straight and your legs extended or hanging over the edge of the bed or table. Turn your head halfway toward the affected ear or side as told by your health care provider. Lie backward quickly with your head turned until you are lying flat on your back. Your head should dangle  (head-hanging position). You may want to position a pillow under your shoulders. Hold this position for at least 30 seconds. If you feel dizzy or have symptoms of vertigo, continue to hold the position until the symptoms stop. Turn your head to the opposite direction until your unaffected ear is facing down. Your head should continue to dangle. Hold this position for at least 30 seconds. If you feel dizzy or have symptoms of vertigo, continue to hold the position until the symptoms stop. Turn your whole body to the same side as your head so that you are positioned on your side. Your head will now be nearly facedown and no longer needs to dangle. Hold for at least 30 seconds. If you feel dizzy or have symptoms of vertigo, continue to hold the position until the symptoms stop. Sit back up. You can repeat the maneuver in 24 hours if your vertigo does not go away. Follow these instructions at home: For 24 hours after doing the Epley maneuver: Keep your head in an upright position. When lying down to sleep or rest, keep your head raised (elevated) with two or more pillows. Avoid excessive neck movements. Activity Do not drive or use machinery if you feel dizzy. After doing the Epley maneuver, return to your normal activities as told by your health care provider. Ask your health care provider what activities are safe for you. General instructions Drink enough  fluid to keep your urine pale yellow. Do not drink alcohol. Take over-the-counter and prescription medicines only as told by your health care provider. Keep all follow-up visits. This is important. Preventing vertigo symptoms Ask your health care provider if there is anything you should do at home to prevent vertigo. He or she may recommend that you: Keep your head elevated with two or more pillows while you sleep. Do not sleep on the side of your affected ear. Get up slowly from bed. Avoid sudden movements during the day. Avoid extreme head  positions or movement, such as looking up or bending over. Contact a health care provider if: Your vertigo gets worse. You have other symptoms, including: Nausea. Vomiting. Headache. Get help right away if you: Have vision changes. Have a headache or neck pain that is severe or getting worse. Cannot stop vomiting. Have new numbness or weakness in any part of your body. These symptoms may represent a serious problem that is an emergency. Do not wait to see if the symptoms will go away. Get medical help right away. Call your local emergency services (911 in the [place] Do not drive yourself to the hospital. Summary Vertigo is the feeling that you or your surroundings are moving when they are not. The Epley maneuver is an exercise that relieves symptoms of vertigo. If the Epley maneuver is done correctly, it is considered safe. This information is not intended to replace advice given to you by your health care provider. Make sure you discuss any questions you have with your health care provider. Document Revised: 05/08/2020 Document Reviewed: 05/08/2020 Elsevier Patient Education  [place] for Left Posterior / Anterior Canalithiasis    Sitting on bed: 1. Turn head 45 left. (a) Lie back slowly, shoulders on pillow, head on bed. (b) Hold _20___ seconds. 2. Keeping head on bed, turn head 90 right. Hold _20___ seconds. 3. Roll to right, head on 45 angle down toward bed. Hold ____ seconds. 4. Sit up on right side of bed. Repeat ___3_ times per session. Do __2__ sessions per day.   Feet Apart (Compliant Surface) Head Motion - Eyes Closed    Stand on compliant surface: ____pillows____ with feet shoulder width apart. Close eyes and move head slowly, up and down. Repeat __1__ times per session. Do _1-2___ sessions per day.  Copyright  VHI. All rights reserved.

## 2022-03-26 ENCOUNTER — Ambulatory Visit: Payer: Managed Care, Other (non HMO)

## 2022-03-26 DIAGNOSIS — R42 Dizziness and giddiness: Secondary | ICD-10-CM | POA: Diagnosis not present

## 2022-03-26 DIAGNOSIS — R262 Difficulty in walking, not elsewhere classified: Secondary | ICD-10-CM

## 2022-03-26 NOTE — Therapy (Signed)
OUTPATIENT PHYSICAL THERAPY VESTIBULAR TREATMENT NOTE   Patient Name: Sara Patrick MRN: [id number] DOB:12-Mar-1971, 51 y.o., female Today's Date: 03/26/2022  PCP: McDiarmid, Leighton Roach, MD  REFERRING PROVIDER: McDiarmid, Leighton Roach, MD    PT End of Session - 03/26/22 0806     Visit Number 5    Number of Visits 9    Date for PT Re-Evaluation 04/14/22    Authorization Type [employer] managed    PT Start Time 0804   patient late   PT Stop Time 0845    PT Time Calculation (min) 41 min    Activity Tolerance Patient tolerated treatment well    Behavior During Therapy [facility] for tasks assessed/performed               Past Medical History:  Diagnosis Date   Acute rhinosinusitis 05/22/2016   Ankle pain, right, chronic 02/17/2011   Asthma, intermittent    Breast mass, left 02/01/2017   07/2018 Mammogram: Benign left breast mass stable size over 2 years - return to annual mammographic screening  Probably benign left breast mass on 08/06/2017 - Follow up coincident surveillance and bilateral screening mammogram in one year.  Probably benign left breast mass is mammographically stable 01/2017 mammogram.  Follow up surveillance mammography in 6 months.    GASTROESOPHAGEAL REFLUX, NO ESOPHAGITIS 10/14/2007   History of ankle sprain, right 01/30/2010   History of endometriosis 08/19/2006   Myopic astigmatism    Overweight(278.02) 01/10/2009   Patellofemoral dysfunction of left knee 05/10/2015   PEPTIC ULCER DIS., UNSPEC. W/O OBSTRUCTION 10/14/2007   Qualifier: Diagnosis of  By: McDiarmid MD, Todd     Peptic ulcer disease    Perennial allergic rhinitis with seasonal variation 10/14/2007   Qualifier: Diagnosis of  By: McDiarmid MD, Todd     Plantar fasciitis of left foot 07/29/2018   RHINITIS, ALLERGIC 10/14/2007   Tympanic membrane perforation 02/2014   Right TM   Varicose vein of leg 03/01/2012   Past Surgical History:  Procedure Laterality Date   TOTAL ABDOMINAL HYSTERECTOMY W/ BILATERAL  SALPINGOOPHORECTOMY  12/2005   Hysterectomy: Fibroids &L.ovarian endometrriosis, adenomyosis (07/07, Dr Yolanda Bonine [OBGYN]) [   Patient Active Problem List   Diagnosis Date Noted   Vestibular hypofunction, left 03/10/2022   Toenail lesion 07/23/2017   Obesity 05/10/2015   Allergic conjunctivitis and rhinitis 10/14/2007   Mild intermittent asthma 10/14/2007   Chronic gastroesophageal reflux disease 10/14/2007   Menopausal and postmenopausal disorder 02/24/2007    ONSET DATE: 03/01/22   REFERRING DIAG: R42 (ICD-10-CM) - Vertigo   THERAPY DIAG:  Dizziness and giddiness  Difficulty in walking, not elsewhere classified  Rationale for Evaluation and Treatment Rehabilitation  PERTINENT HISTORY: 1 prior episode of vertigo ~3 years ago  PRECAUTIONS: fall  SUBJECTIVE: Patient reports doing fair. Started driving yesterday and states that it stirs up some dizziness for her. Did do brandt-daroff and standing on pillows exercises with mild dizziness.   PAIN:  Are you having pain? No  There were no vitals filed for this visit.    OBJECTIVE:   VESTIBULAR TREATMENT: -R dix hallpike (-) -L dixhallpike (+) with L torsional upbeating nystagmus, low amplitude, lasting >60s indicative of possible cupulolithiasis   -treated with Epley x 4 (last 2 with vibration)  -Semont maneuver x1 with vibration   -recheck L dixhallpike with no nystagmus and symptoms noted    PATIENT EDUCATION: Education details: Resume brandt daroff and balance on pillows to tolerance  Person educated: Patient Education method: Explanation,  demonstration, handout from [employer] Education comprehension: verbalized understanding, demonstrated understanding    HOME EXERCISE PROGRAM  Sit to Side-Lying    Sit on edge of bed. 1. Turn head 45 to right. 2. Maintain head position and lie down slowly on left side. Hold until symptoms subside. 3. Sit up slowly. Hold until symptoms subside. 4. Turn head 45 to left. 5.  Maintain head position and lie down slowly on right side. Hold until symptoms subside. 6. Sit up slowly. Repeat sequence _5___ times per session. Do _2-3___ sessions per day.      How to Perform the Epley Maneuver The Epley maneuver is an exercise that relieves symptoms of vertigo. Vertigo is the feeling that you or your surroundings are moving when they are not. When you feel vertigo, you may feel like the room is spinning and may have trouble walking. The Epley maneuver is used for a type of vertigo caused by a calcium deposit in a part of the inner ear. The maneuver involves changing head positions to help the deposit move out of the area. You can do this maneuver at home whenever you have symptoms of vertigo. You can repeat it in 24 hours if your vertigo has not gone away. Even though the Epley maneuver may relieve your vertigo for a few weeks, it is possible that your symptoms will return. This maneuver relieves vertigo, but it does not relieve dizziness. What are the risks? If it is done correctly, the Epley maneuver is considered safe. Sometimes it can lead to dizziness or nausea that goes away after a short time. If you develop other symptoms--such as changes in vision, weakness, or numbness--stop doing the maneuver and call your health care provider. Supplies needed: A bed or table. A pillow. How to do the Epley maneuver     Sit on the edge of a bed or table with your back straight and your legs extended or hanging over the edge of the bed or table. Turn your head halfway toward the affected ear or side as told by your health care provider. Lie backward quickly with your head turned until you are lying flat on your back. Your head should dangle (head-hanging position). You may want to position a pillow under your shoulders. Hold this position for at least 30 seconds. If you feel dizzy or have symptoms of vertigo, continue to hold the position until the symptoms stop. Turn your head  to the opposite direction until your unaffected ear is facing down. Your head should continue to dangle. Hold this position for at least 30 seconds. If you feel dizzy or have symptoms of vertigo, continue to hold the position until the symptoms stop. Turn your whole body to the same side as your head so that you are positioned on your side. Your head will now be nearly facedown and no longer needs to dangle. Hold for at least 30 seconds. If you feel dizzy or have symptoms of vertigo, continue to hold the position until the symptoms stop. Sit back up. You can repeat the maneuver in 24 hours if your vertigo does not go away. Follow these instructions at home: For 24 hours after doing the Epley maneuver: Keep your head in an upright position. When lying down to sleep or rest, keep your head raised (elevated) with two or more pillows. Avoid excessive neck movements. Activity Do not drive or use machinery if you feel dizzy. After doing the Epley maneuver, return to your normal activities as told by your  health care provider. Ask your health care provider what activities are safe for you. General instructions Drink enough fluid to keep your urine pale yellow. Do not drink alcohol. Take over-the-counter and prescription medicines only as told by your health care provider. Keep all follow-up visits. This is important. Preventing vertigo symptoms Ask your health care provider if there is anything you should do at home to prevent vertigo. He or she may recommend that you: Keep your head elevated with two or more pillows while you sleep. Do not sleep on the side of your affected ear. Get up slowly from bed. Avoid sudden movements during the day. Avoid extreme head positions or movement, such as looking up or bending over. Contact a health care provider if: Your vertigo gets worse. You have other symptoms, including: Nausea. Vomiting. Headache. Get help right away if you: Have vision  changes. Have a headache or neck pain that is severe or getting worse. Cannot stop vomiting. Have new numbness or weakness in any part of your body. These symptoms may represent a serious problem that is an emergency. Do not wait to see if the symptoms will go away. Get medical help right away. Call your local emergency services (911 in the [place] Do not drive yourself to the hospital. Summary Vertigo is the feeling that you or your surroundings are moving when they are not. The Epley maneuver is an exercise that relieves symptoms of vertigo. If the Epley maneuver is done correctly, it is considered safe. This information is not intended to replace advice given to you by your health care provider. Make sure you discuss any questions you have with your health care provider. Document Revised: 05/08/2020 Document Reviewed: 05/08/2020 Elsevier Patient Education  [place] for Left Posterior / Anterior Canalithiasis    Sitting on bed: 1. Turn head 45 left. (a) Lie back slowly, shoulders on pillow, head on bed. (b) Hold _20___ seconds. 2. Keeping head on bed, turn head 90 right. Hold _20___ seconds. 3. Roll to right, head on 45 angle down toward bed. Hold ____ seconds. 4. Sit up on right side of bed. Repeat ___3_ times per session. Do __2__ sessions per day.   Feet Apart (Compliant Surface) Head Motion - Eyes Closed    Stand on compliant surface: ____pillows____ with feet shoulder width apart. Close eyes and move head slowly, up and down. Repeat __1__ times per session. Do _1-2___ sessions per day.  Copyright  VHI. All rights reserved.   GOALS: Goals reviewed with patient? Yes   SHORT TERM GOALS= LTG based on POC length    LONG TERM GOALS: Target date: 04/14/2022     Pt will be independent with final HEP for improved balance  Baseline: to be provided Goal status: INITIAL   2.  Pt will improve FGA to >/= 28/30 to demonstrate improved balance and  reduced fall risk   Baseline: 23/30 Goal status: INITIAL   3.  Patient will improve time on condition 4 of M-CTSIB to >/=30s Baseline: 7S Goal status: INITIAL   4.  Pt will report </= 1/5 for all movements on MSQ to indicate improvement in motion sensitivity and improved activity tolerance.    Baseline: 2/5 Goal status: INITIAL   5. Patient will score >/= 66 on FOTO assessment to demonstrate reduction in symptoms            Baseline: 60            Goal status: INITIAL  ASSESSMENT:   CLINICAL IMPRESSION: Patient seen for skilled PT session with emphasis on BPPV eval and treat. Patient with (+) L dixhallpike with non-fatiguing, low amplitude L torsion upbeating nystagmus indicative of likely L posterior canal cupulolithiasis. Unable to clear with 2 Epley maneuvers, so added vibration with good result. Cupulolithiasis appears to have resolved with semont + vibration upon final check. Continue POC.    OBJECTIVE IMPAIRMENTS Abnormal gait, decreased balance, decreased knowledge of condition, difficulty walking, and dizziness.    ACTIVITY LIMITATIONS lifting, bending, standing, squatting, locomotion level, and caring for others   PARTICIPATION LIMITATIONS: cleaning, driving, shopping, community activity, and occupation   PERSONAL FACTORS Past/current experiences and 1 comorbidity: 1 prior episode of vertigo  are also affecting patient's functional outcome.    REHAB POTENTIAL: Good   CLINICAL DECISION MAKING: Evolving/moderate complexity   EVALUATION COMPLEXITY: Moderate     PLAN: PT FREQUENCY: 2x/week   PT DURATION: 4 weeks   PLANNED INTERVENTIONS: Therapeutic exercises, Therapeutic activity, Neuromuscular re-education, Balance training, Gait training, Patient/Family education, Self Care, Joint mobilization, Stair training, Vestibular training, Canalith repositioning, Visual/preceptual remediation/compensation, DME instructions, Manual therapy, and Re-evaluation   PLAN FOR NEXT  SESSION:   recheck Lt BPPV and treat prn; balance/vestibular exercises prn      Westley Foots, PT Westley Foots, PT, DPT, CBIS 03/26/2022, 9:04 AM

## 2022-03-30 ENCOUNTER — Ambulatory Visit: Payer: Managed Care, Other (non HMO)

## 2022-03-30 DIAGNOSIS — R42 Dizziness and giddiness: Secondary | ICD-10-CM

## 2022-03-30 DIAGNOSIS — R262 Difficulty in walking, not elsewhere classified: Secondary | ICD-10-CM

## 2022-03-30 NOTE — Therapy (Signed)
OUTPATIENT PHYSICAL THERAPY VESTIBULAR TREATMENT NOTE   Patient Name: Sara Patrick MRN: [id number] DOB:03/17/1971, 51 y.o., female Today's Date: 03/30/2022  PCP: McDiarmid, Leighton Roach, MD  REFERRING PROVIDER: McDiarmid, Leighton Roach, MD    PT End of Session - 03/30/22 [phone]     Visit Number 6    Number of Visits 9    Date for PT Re-Evaluation 04/14/22    Authorization Type [employer] managed    PT Start Time 0845    PT Stop Time 0930    PT Time Calculation (min) 45 min    Activity Tolerance Patient tolerated treatment well    Behavior During Therapy [facility] for tasks assessed/performed               Past Medical History:  Diagnosis Date   Acute rhinosinusitis 05/22/2016   Ankle pain, right, chronic 02/17/2011   Asthma, intermittent    Breast mass, left 02/01/2017   07/2018 Mammogram: Benign left breast mass stable size over 2 years - return to annual mammographic screening  Probably benign left breast mass on 08/06/2017 - Follow up coincident surveillance and bilateral screening mammogram in one year.  Probably benign left breast mass is mammographically stable 01/2017 mammogram.  Follow up surveillance mammography in 6 months.    GASTROESOPHAGEAL REFLUX, NO ESOPHAGITIS 10/14/2007   History of ankle sprain, right 01/30/2010   History of endometriosis 08/19/2006   Myopic astigmatism    Overweight(278.02) 01/10/2009   Patellofemoral dysfunction of left knee 05/10/2015   PEPTIC ULCER DIS., UNSPEC. W/O OBSTRUCTION 10/14/2007   Qualifier: Diagnosis of  By: McDiarmid MD, Todd     Peptic ulcer disease    Perennial allergic rhinitis with seasonal variation 10/14/2007   Qualifier: Diagnosis of  By: McDiarmid MD, Todd     Plantar fasciitis of left foot 07/29/2018   RHINITIS, ALLERGIC 10/14/2007   Tympanic membrane perforation 02/2014   Right TM   Varicose vein of leg 03/01/2012   Past Surgical History:  Procedure Laterality Date   TOTAL ABDOMINAL HYSTERECTOMY W/ BILATERAL SALPINGOOPHORECTOMY  12/2005    Hysterectomy: Fibroids &L.ovarian endometrriosis, adenomyosis (07/07, Dr Yolanda Bonine [OBGYN]) [   Patient Active Problem List   Diagnosis Date Noted   Vestibular hypofunction, left 03/10/2022   Toenail lesion 07/23/2017   Obesity 05/10/2015   Allergic conjunctivitis and rhinitis 10/14/2007   Mild intermittent asthma 10/14/2007   Chronic gastroesophageal reflux disease 10/14/2007   Menopausal and postmenopausal disorder 02/24/2007    ONSET DATE: 03/01/22   REFERRING DIAG: R42 (ICD-10-CM) - Vertigo   THERAPY DIAG:  Dizziness and giddiness  Difficulty in walking, not elsewhere classified  Rationale for Evaluation and Treatment Rehabilitation  PERTINENT HISTORY: 1 prior episode of vertigo ~3 years ago  PRECAUTIONS: fall  SUBJECTIVE: Patient reports doing about the same. Still with dizziness with driving and ambulation at times.   PAIN:  Are you having pain? No  There were no vitals filed for this visit.    OBJECTIVE:   VESTIBULAR TREATMENT: -L dix Stauber pike (-)  -Standing Balance: Surface: Floor and Pillows Position: Narrow Base of Support Feet Hip Width Apart Completed with: Eyes Open and Eyes Closed; Head Turns x 20 Reps and Head Nods x 20 Reps   Tandem Stance:  Surface: Floor and Pillows Completed with: Eyes Open and Eyes Closed;   Time: 2x30s   -seated ball toss tracking ball x30 reps -standing ball toss tracking ball x30 reps -115' tossing ball tracking -115' tracking ball "around the [place]"  -standing  on pattern rug Edwyna Shell Chart  -standing on rug on Aire[employer] Hart Chart (slight increase in dizziness noted)  -amb toward pattern wall with VOR x1 horizontal and then backward 38ft x3  -amb toward pattern wall with VOR x1 vertical and then backward 37ft x3 (Slight increase in dizziness, starting to resolve by 3rd rep)  -standing in corner diagonal Hart Chart-> standing on Airex      PATIENT EDUCATION: Education details: Continue HEP, progressing to NBOS,  semi-tandem  Person educated: Patient Education method: Explanation, demonstration, handout from [employer] Education comprehension: verbalized understanding, demonstrated understanding    HOME EXERCISE PROGRAM  Sit to Side-Lying    Sit on edge of bed. 1. Turn head 45 to right. 2. Maintain head position and lie down slowly on left side. Hold until symptoms subside. 3. Sit up slowly. Hold until symptoms subside. 4. Turn head 45 to left. 5. Maintain head position and lie down slowly on right side. Hold until symptoms subside. 6. Sit up slowly. Repeat sequence _5___ times per session. Do _2-3___ sessions per day.    Self Treatment for Left Posterior / Anterior Canalithiasis    Sitting on bed: 1. Turn head 45 left. (a) Lie back slowly, shoulders on pillow, head on bed. (b) Hold _20___ seconds. 2. Keeping head on bed, turn head 90 right. Hold _20___ seconds. 3. Roll to right, head on 45 angle down toward bed. Hold ____ seconds. 4. Sit up on right side of bed. Repeat ___3_ times per session. Do __2__ sessions per day.   Feet Apart (Compliant Surface) Head Motion - Eyes Closed    Stand on compliant surface: ____pillows____ with feet shoulder width apart. Close eyes and move head slowly, up and down. Repeat __1__ times per session. Do _1-2___ sessions per day.  Copyright  VHI. All rights reserved.   GOALS: Goals reviewed with patient? Yes   SHORT TERM GOALS= LTG based on POC length    LONG TERM GOALS: Target date: 04/14/2022     Pt will be independent with final HEP for improved balance  Baseline: to be provided Goal status: INITIAL   2.  Pt will improve FGA to >/= 28/30 to demonstrate improved balance and reduced fall risk   Baseline: 23/30 Goal status: INITIAL   3.  Patient will improve time on condition 4 of M-CTSIB to >/=30s Baseline: 7S Goal status: INITIAL   4.  Pt will report </= 1/5 for all movements on MSQ to indicate improvement in motion sensitivity and  improved activity tolerance.    Baseline: 2/5 Goal status: INITIAL   5. Patient will score >/= 66 on FOTO assessment to demonstrate reduction in symptoms            Baseline: 60            Goal status: INITIAL   ASSESSMENT:   CLINICAL IMPRESSION: Patient seen for skilled PT session with emphasis on vestibular retraining. L dix hallpike (-) indicating possible resolution of prior L posterior canal cupulolithiasis. Patient tolerating gaze stabilization exercises and VOR cancellation exercises well with mild increase in dizziness noted with vertical head movements > horizontal. Continue POC.    OBJECTIVE IMPAIRMENTS Abnormal gait, decreased balance, decreased knowledge of condition, difficulty walking, and dizziness.    ACTIVITY LIMITATIONS lifting, bending, standing, squatting, locomotion level, and caring for others   PARTICIPATION LIMITATIONS: cleaning, driving, shopping, community activity, and occupation   PERSONAL FACTORS Past/current experiences and 1 comorbidity: 1 prior episode of vertigo  are also affecting patient's  functional outcome.    REHAB POTENTIAL: Good   CLINICAL DECISION MAKING: Evolving/moderate complexity   EVALUATION COMPLEXITY: Moderate     PLAN: PT FREQUENCY: 2x/week   PT DURATION: 4 weeks   PLANNED INTERVENTIONS: Therapeutic exercises, Therapeutic activity, Neuromuscular re-education, Balance training, Gait training, Patient/Family education, Self Care, Joint mobilization, Stair training, Vestibular training, Canalith repositioning, Visual/preceptual remediation/compensation, DME instructions, Manual therapy, and Re-evaluation   PLAN FOR NEXT SESSION:   recheck Lt BPPV and treat prn; balance/vestibular exercises prn      Debbora Dus, PT Debbora Dus, PT, DPT, CBIS 03/30/2022, 9:38 AM

## 2022-04-02 ENCOUNTER — Ambulatory Visit: Payer: Managed Care, Other (non HMO)

## 2022-04-02 DIAGNOSIS — R42 Dizziness and giddiness: Secondary | ICD-10-CM

## 2022-04-02 DIAGNOSIS — R262 Difficulty in walking, not elsewhere classified: Secondary | ICD-10-CM

## 2022-04-02 NOTE — Therapy (Signed)
OUTPATIENT PHYSICAL THERAPY VESTIBULAR TREATMENT NOTE   Patient Name: Sara Patrick MRN: [id number] DOB:30-Oct-1970, 51 y.o., female Today's Date: 04/02/2022  PCP: McDiarmid, Leighton Roach, MD  REFERRING PROVIDER: McDiarmid, Leighton Roach, MD    PT End of Session - 04/02/22 0810     Visit Number 7    Number of Visits 9    Date for PT Re-Evaluation 04/14/22    Authorization Type [employer] managed    PT Start Time 0807   patient late   PT Stop Time 0845    PT Time Calculation (min) 38 min    Activity Tolerance Patient tolerated treatment well    Behavior During Therapy [facility] for tasks assessed/performed               Past Medical History:  Diagnosis Date   Acute rhinosinusitis 05/22/2016   Ankle pain, right, chronic 02/17/2011   Asthma, intermittent    Breast mass, left 02/01/2017   07/2018 Mammogram: Benign left breast mass stable size over 2 years - return to annual mammographic screening  Probably benign left breast mass on 08/06/2017 - Follow up coincident surveillance and bilateral screening mammogram in one year.  Probably benign left breast mass is mammographically stable 01/2017 mammogram.  Follow up surveillance mammography in 6 months.    GASTROESOPHAGEAL REFLUX, NO ESOPHAGITIS 10/14/2007   History of ankle sprain, right 01/30/2010   History of endometriosis 08/19/2006   Myopic astigmatism    Overweight(278.02) 01/10/2009   Patellofemoral dysfunction of left knee 05/10/2015   PEPTIC ULCER DIS., UNSPEC. W/O OBSTRUCTION 10/14/2007   Qualifier: Diagnosis of  By: McDiarmid MD, Todd     Peptic ulcer disease    Perennial allergic rhinitis with seasonal variation 10/14/2007   Qualifier: Diagnosis of  By: McDiarmid MD, Todd     Plantar fasciitis of left foot 07/29/2018   RHINITIS, ALLERGIC 10/14/2007   Tympanic membrane perforation 02/2014   Right TM   Varicose vein of leg 03/01/2012   Past Surgical History:  Procedure Laterality Date   TOTAL ABDOMINAL HYSTERECTOMY W/ BILATERAL  SALPINGOOPHORECTOMY  12/2005   Hysterectomy: Fibroids &L.ovarian endometrriosis, adenomyosis (07/07, Dr Yolanda Bonine [OBGYN]) [   Patient Active Problem List   Diagnosis Date Noted   Vestibular hypofunction, left 03/10/2022   Toenail lesion 07/23/2017   Obesity 05/10/2015   Allergic conjunctivitis and rhinitis 10/14/2007   Mild intermittent asthma 10/14/2007   Chronic gastroesophageal reflux disease 10/14/2007   Menopausal and postmenopausal disorder 02/24/2007    ONSET DATE: 03/01/22   REFERRING DIAG: R42 (ICD-10-CM) - Vertigo   THERAPY DIAG:  Dizziness and giddiness  Difficulty in walking, not elsewhere classified  Rationale for Evaluation and Treatment Rehabilitation  PERTINENT HISTORY: 1 prior episode of vertigo ~3 years ago  PRECAUTIONS: fall  SUBJECTIVE: Patient reports doing well. Improving dizziness. Exercises going well.   PAIN:  Are you having pain? No  There were no vitals filed for this visit.    OBJECTIVE:   VESTIBULAR TREATMENT: -L dix hallpike (-)  -SOT:  -composite: 80 -seated habituation picking cones up from floor -> progressed to busy pattern -> progressed to standing and picking up cones  -no increase in dizziness -standing Hart chart with pattern background-> standing on foam   -No increase in dizziness   PATIENT EDUCATION: Education details: Continue HEP, SOT results Person educated: Patient Education method: Explanation, demonstration, handout from [employer] Education comprehension: verbalized understanding, demonstrated understanding    HOME EXERCISE PROGRAM  Sit to Side-Lying    Sit on  edge of bed. 1. Turn head 45 to right. 2. Maintain head position and lie down slowly on left side. Hold until symptoms subside. 3. Sit up slowly. Hold until symptoms subside. 4. Turn head 45 to left. 5. Maintain head position and lie down slowly on right side. Hold until symptoms subside. 6. Sit up slowly. Repeat sequence _5___ times per session. Do  _2-3___ sessions per day.    Self Treatment for Left Posterior / Anterior Canalithiasis    Sitting on bed: 1. Turn head 45 left. (a) Lie back slowly, shoulders on pillow, head on bed. (b) Hold _20___ seconds. 2. Keeping head on bed, turn head 90 right. Hold _20___ seconds. 3. Roll to right, head on 45 angle down toward bed. Hold ____ seconds. 4. Sit up on right side of bed. Repeat ___3_ times per session. Do __2__ sessions per day.   Feet Apart (Compliant Surface) Head Motion - Eyes Closed    Stand on compliant surface: ____pillows____ with feet shoulder width apart. Close eyes and move head slowly, up and down. Repeat __1__ times per session. Do _1-2___ sessions per day.  Copyright  VHI. All rights reserved.   GOALS: Goals reviewed with patient? Yes   SHORT TERM GOALS= LTG based on POC length    LONG TERM GOALS: Target date: 04/14/2022     Pt will be independent with final HEP for improved balance  Baseline: to be provided Goal status: INITIAL   2.  Pt will improve FGA to >/= 28/30 to demonstrate improved balance and reduced fall risk   Baseline: 23/30 Goal status: INITIAL   3.  Patient will improve time on condition 4 of M-CTSIB to >/=30s Baseline: 7S Goal status: INITIAL   4.  Pt will report </= 1/5 for all movements on MSQ to indicate improvement in motion sensitivity and improved activity tolerance.    Baseline: 2/5 Goal status: INITIAL   5. Patient will score >/= 66 on FOTO assessment to demonstrate reduction in symptoms            Baseline: 60            Goal status: INITIAL   ASSESSMENT:   CLINICAL IMPRESSION: Patient seen for skilled PT session with emphasis on vestibular retraining. L dix hallpike remains (-) indicating possible resolution of prior L posterior canal cupulolithiasis. Patient with significant improvement in her SOT score and performance as well as no increase in symptoms during testing. Progressing well with habituation and would  benefit from further higher level vestibular retraining. Continue POC.   OBJECTIVE IMPAIRMENTS Abnormal gait, decreased balance, decreased knowledge of condition, difficulty walking, and dizziness.    ACTIVITY LIMITATIONS lifting, bending, standing, squatting, locomotion level, and caring for others   PARTICIPATION LIMITATIONS: cleaning, driving, shopping, community activity, and occupation   PERSONAL FACTORS Past/current experiences and 1 comorbidity: 1 prior episode of vertigo  are also affecting patient's functional outcome.    REHAB POTENTIAL: Good   CLINICAL DECISION MAKING: Evolving/moderate complexity   EVALUATION COMPLEXITY: Moderate     PLAN: PT FREQUENCY: 2x/week   PT DURATION: 4 weeks   PLANNED INTERVENTIONS: Therapeutic exercises, Therapeutic activity, Neuromuscular re-education, Balance training, Gait training, Patient/Family education, Self Care, Joint mobilization, Stair training, Vestibular training, Canalith repositioning, Visual/preceptual remediation/compensation, DME instructions, Manual therapy, and Re-evaluation   PLAN FOR NEXT SESSION:   recheck Lt BPPV and treat prn; balance/vestibular exercises prn      Debbora Dus, PT Debbora Dus, PT, DPT, CBIS 04/02/2022, 8:49 AM

## 2022-04-06 ENCOUNTER — Ambulatory Visit: Payer: Managed Care, Other (non HMO) | Admitting: Physical Therapy

## 2022-04-06 ENCOUNTER — Encounter: Payer: Self-pay | Admitting: Physical Therapy

## 2022-04-06 DIAGNOSIS — R42 Dizziness and giddiness: Secondary | ICD-10-CM | POA: Diagnosis not present

## 2022-04-06 DIAGNOSIS — R262 Difficulty in walking, not elsewhere classified: Secondary | ICD-10-CM

## 2022-04-06 NOTE — Patient Instructions (Addendum)
1) Letter exercise - patterned background - 60 secs each direction;  2-3x day  2) Marching on pillows - in corner with chair in front - eyes open/closed with head turns  3) Practice walking with eyes closed with head turns along counter but try not to touch

## 2022-04-06 NOTE — Therapy (Signed)
OUTPATIENT PHYSICAL THERAPY VESTIBULAR TREATMENT NOTE   Patient Name: Sara Patrick MRN: [id number] DOB:May 09, 1971, 50 y.o., female Today's Date: 04/06/2022  PCP: McDiarmid, Blane Ohara, MD  REFERRING PROVIDER: McDiarmid, Blane Ohara, MD    PT End of Session - 04/06/22 2004     Visit Number 8    Number of Visits 9    Date for PT Re-Evaluation 04/14/22    Authorization Type [employer] managed    PT Start Time 0805    PT Stop Time 0845    PT Time Calculation (min) 40 min    Activity Tolerance Patient tolerated treatment well    Behavior During Therapy [facility] for tasks assessed/performed                Past Medical History:  Diagnosis Date   Acute rhinosinusitis 05/22/2016   Ankle pain, right, chronic 02/17/2011   Asthma, intermittent    Breast mass, left 02/01/2017   07/2018 Mammogram: Benign left breast mass stable size over 2 years - return to annual mammographic screening  Probably benign left breast mass on 08/06/2017 - Follow up coincident surveillance and bilateral screening mammogram in one year.  Probably benign left breast mass is mammographically stable 01/2017 mammogram.  Follow up surveillance mammography in 6 months.    GASTROESOPHAGEAL REFLUX, NO ESOPHAGITIS 10/14/2007   History of ankle sprain, right 01/30/2010   History of endometriosis 08/19/2006   Myopic astigmatism    Overweight(278.02) 01/10/2009   Patellofemoral dysfunction of left knee 05/10/2015   PEPTIC ULCER DIS., UNSPEC. W/O OBSTRUCTION 10/14/2007   Qualifier: Diagnosis of  By: McDiarmid MD, Todd     Peptic ulcer disease    Perennial allergic rhinitis with seasonal variation 10/14/2007   Qualifier: Diagnosis of  By: McDiarmid MD, Todd     Plantar fasciitis of left foot 07/29/2018   RHINITIS, ALLERGIC 10/14/2007   Tympanic membrane perforation 02/2014   Right TM   Varicose vein of leg 03/01/2012   Past Surgical History:  Procedure Laterality Date   TOTAL ABDOMINAL HYSTERECTOMY W/ BILATERAL SALPINGOOPHORECTOMY  12/2005    Hysterectomy: Fibroids &L.ovarian endometrriosis, adenomyosis (07/07, Dr Erskin Burnet [OBGYN]) [   Patient Active Problem List   Diagnosis Date Noted   Vestibular hypofunction, left 03/10/2022   Toenail lesion 07/23/2017   Obesity 05/10/2015   Allergic conjunctivitis and rhinitis 10/14/2007   Mild intermittent asthma 10/14/2007   Chronic gastroesophageal reflux disease 10/14/2007   Menopausal and postmenopausal disorder 02/24/2007    ONSET DATE: 03/01/22   REFERRING DIAG: R42 (ICD-10-CM) - Vertigo   THERAPY DIAG:  Difficulty in walking, not elsewhere classified  Rationale for Evaluation and Treatment Rehabilitation  PERTINENT HISTORY: 1 prior episode of vertigo ~3 years ago  PRECAUTIONS: fall  SUBJECTIVE: Patient reports doing well. Reports no dizziness at this time - feels that she is 98% back to her normal self; would like to have 1 more follow up PT visit to re-evaluate   PAIN:  Are you having pain? No  There were no vitals filed for this visit.    OBJECTIVE:   VESTIBULAR TREATMENT:  NeuroRe-ed:  amb. Tossing ball straight up, then progressed to Rt/Lt sides; amb. Making circles clockwise/counterclockwise with ball for improved VOR and gaze stabilization Standing Balance: Surface: Pillows Position: Feet Hip Width Apart Completed with: Eyes Open and Eyes Closed; Head Turns x 5 Reps  Marching on pillows in corner - EO with horizontal head turns 5 reps and then vertical head turns 5 reps- with CGA due to LOB  Trunk rotation - touching each side of wall - Rt and Lt sides, then diagonals; 5 reps with EO and then with EC 5 reps each direction  X1 viewing exercise - 60 secs horizontal x 1 rep with target on patterned background:  60 secs x 1 rep vertically (patterned background)    Pt performed marching on incline with blue mat placed on top for compliant surface training - EO 5 reps without head turns, then with head turns horizontal and vertical 5 reps each    PATIENT EDUCATION: Education details: Updated HEP; discontinued habituation exercises due to no c/o vertigo at this time 1)  Letter exercise - patterned background - 60 secs each direction;  2-3x day  2) Marching on pillows - in corner with chair in front - eyes open/closed with head turns  3) Practice walking with eyes closed with head turns along counter but try not to touch Person educated: Patient Education method: Explanation, demonstration, handout from [employer] Education comprehension: verbalized understanding, demonstrated understanding       Feet Apart (Compliant Surface) Head Motion - Eyes Closed    Stand on compliant surface: ____pillows____ with feet shoulder width apart. Close eyes and move head slowly, up and down. Repeat __1__ times per session. Do _1-2___ sessions per day.  Copyright  VHI. All rights reserved.   GOALS: Goals reviewed with patient? Yes   SHORT TERM GOALS= LTG based on POC length    LONG TERM GOALS: Target date: 04/14/2022     Pt will be independent with final HEP for improved balance  Baseline: to be provided Goal status: INITIAL   2.  Pt will improve FGA to >/= 28/30 to demonstrate improved balance and reduced fall risk   Baseline: 23/30 Goal status: INITIAL   3.  Patient will improve time on condition 4 of M-CTSIB to >/=30s Baseline: 7S Goal status: INITIAL   4.  Pt will report </= 1/5 for all movements on MSQ to indicate improvement in motion sensitivity and improved activity tolerance.    Baseline: 2/5 Goal status: INITIAL   5. Patient will score >/= 66 on FOTO assessment to demonstrate reduction in symptoms            Baseline: 60            Goal status: INITIAL   ASSESSMENT:   CLINICAL IMPRESSION:  PT session focused on balance/vestibular exercises to improve balance with dynamic gait and to facilitate increased vestibular input in maintaining balance.  Pt reported no dizziness with any exercises in today's session, only  mild postural sway and unsteadiness.  Pt was most challenged by maintaining balance with marching on compliant surface with head turns, due to difficulty maintaining SLS on compliant surface.  Performance was noted to improve with practice and repetition.  Plan D/C next session.  OBJECTIVE IMPAIRMENTS Abnormal gait, decreased balance, decreased knowledge of condition, difficulty walking, and dizziness.    ACTIVITY LIMITATIONS lifting, bending, standing, squatting, locomotion level, and caring for others   PARTICIPATION LIMITATIONS: cleaning, driving, shopping, community activity, and occupation   PERSONAL FACTORS Past/current experiences and 1 comorbidity: 1 prior episode of vertigo  are also affecting patient's functional outcome.    REHAB POTENTIAL: Good   CLINICAL DECISION MAKING: Evolving/moderate complexity   EVALUATION COMPLEXITY: Moderate     PLAN: PT FREQUENCY: 2x/week   PT DURATION: 4 weeks   PLANNED INTERVENTIONS: Therapeutic exercises, Therapeutic activity, Neuromuscular re-education, Balance training, Gait training, Patient/Family education, Self Care, Joint mobilization, Stair training, Vestibular  training, Canalith repositioning, Visual/preceptual remediation/compensation, DME instructions, Manual therapy, and Re-evaluation   PLAN FOR NEXT SESSION:   Check LTG's and D/C      Emanuella Nickle, Donavan Burnet, PT  04/06/2022, 8:06 PM

## 2022-04-13 ENCOUNTER — Ambulatory Visit: Payer: Managed Care, Other (non HMO)

## 2022-04-15 ENCOUNTER — Ambulatory Visit: Payer: Managed Care, Other (non HMO)

## 2022-04-15 DIAGNOSIS — R262 Difficulty in walking, not elsewhere classified: Secondary | ICD-10-CM

## 2022-04-15 DIAGNOSIS — R42 Dizziness and giddiness: Secondary | ICD-10-CM

## 2022-04-15 NOTE — Therapy (Signed)
OUTPATIENT PHYSICAL THERAPY VESTIBULAR TREATMENT NOTE   Patient Name: Sara Patrick MRN: [id number] DOB:April 28, 1971, 51 y.o., female Today's Date: 04/15/2022  PCP: McDiarmid, Blane Ohara, MD  REFERRING PROVIDER: McDiarmid, Blane Ohara, MD    PT End of Session - 04/15/22 1314     Visit Number 9    Number of Visits 9    Date for PT Re-Evaluation 04/14/22    Authorization Type [employer] managed    PT Start Time 1315    PT Stop Time 1332   discharge   PT Time Calculation (min) 17 min    Activity Tolerance Patient tolerated treatment well    Behavior During Therapy WFL for tasks assessed/performed                Past Medical History:  Diagnosis Date   Acute rhinosinusitis 05/22/2016   Ankle pain, right, chronic 02/17/2011   Asthma, intermittent    Breast mass, left 02/01/2017   07/2018 Mammogram: Benign left breast mass stable size over 2 years - return to annual mammographic screening  Probably benign left breast mass on 08/06/2017 - Follow up coincident surveillance and bilateral screening mammogram in one year.  Probably benign left breast mass is mammographically stable 01/2017 mammogram.  Follow up surveillance mammography in 6 months.    GASTROESOPHAGEAL REFLUX, NO ESOPHAGITIS 10/14/2007   History of ankle sprain, right 01/30/2010   History of endometriosis 08/19/2006   Myopic astigmatism    Overweight(278.02) 01/10/2009   Patellofemoral dysfunction of left knee 05/10/2015   PEPTIC ULCER DIS., UNSPEC. W/O OBSTRUCTION 10/14/2007   Qualifier: Diagnosis of  By: McDiarmid MD, Todd     Peptic ulcer disease    Perennial allergic rhinitis with seasonal variation 10/14/2007   Qualifier: Diagnosis of  By: McDiarmid MD, Todd     Plantar fasciitis of left foot 07/29/2018   RHINITIS, ALLERGIC 10/14/2007   Tympanic membrane perforation 02/2014   Right TM   Varicose vein of leg 03/01/2012   Past Surgical History:  Procedure Laterality Date   TOTAL ABDOMINAL HYSTERECTOMY W/ BILATERAL  SALPINGOOPHORECTOMY  12/2005   Hysterectomy: Fibroids &L.ovarian endometrriosis, adenomyosis (07/07, Dr Erskin Burnet [OBGYN]) [   Patient Active Problem List   Diagnosis Date Noted   Vestibular hypofunction, left 03/10/2022   Toenail lesion 07/23/2017   Obesity 05/10/2015   Allergic conjunctivitis and rhinitis 10/14/2007   Mild intermittent asthma 10/14/2007   Chronic gastroesophageal reflux disease 10/14/2007   Menopausal and postmenopausal disorder 02/24/2007    ONSET DATE: 03/01/22   REFERRING DIAG: R42 (ICD-10-CM) - Vertigo   THERAPY DIAG:  Difficulty in walking, not elsewhere classified  Dizziness and giddiness  Rationale for Evaluation and Treatment Rehabilitation  PERTINENT HISTORY: 1 prior episode of vertigo ~3 years ago  PRECAUTIONS: fall  SUBJECTIVE: Patient reports doing well. Reports she feels like her "normal self." Walking 3 miles on th treadmill without holding on. Agreeable to dc today.   PAIN:  Are you having pain? No  There were no vitals filed for this visit.    OBJECTIVE:   VESTIBULAR TREATMENT: Goal assessment:  Motion Sensitivity Quotient  Intensity: 0 = none, 1 = Lightheaded, 2 = Mild, 3 = Moderate, 4 = Severe, 5 = Vomiting  Intensity  1. Sitting to supine 0  2. Supine to L side 0  3. Supine to R side 0  4. Supine to sitting 0  5. L Hallpike-Dix 0  6. Up from L  0  7. R Hallpike-Dix 0  8. Up from  R  0  9. Sitting, head  tipped to L knee 0  10. Head up from L  knee 0  11. Sitting, head  tipped to R knee 0  12. Head up from R  knee 0  13. Sitting head turns x5 0  14.Sitting head nods x5 0  15. In stance, 180  turn to L  0  16. In stance, 180  turn to R 0  M-CTSIB 30s condition 4  [facility] PT Assessment - 04/15/22 0001       Functional Gait  Assessment   Gait assessed  Yes    Gait Level Surface Walks 20 ft in less than 7 sec but greater than 5.5 sec, uses assistive device, slower speed, mild gait deviations, or deviates 6-10  in outside of the 12 in walkway width.    Change in Gait Speed Able to smoothly change walking speed without loss of balance or gait deviation. Deviate no more than 6 in outside of the 12 in walkway width.    Gait with Horizontal Head Turns Performs head turns smoothly with no change in gait. Deviates no more than 6 in outside 12 in walkway width    Gait with Vertical Head Turns Performs head turns with no change in gait. Deviates no more than 6 in outside 12 in walkway width.    Gait and Pivot Turn Pivot turns safely within 3 sec and stops quickly with no loss of balance.    Step Over Obstacle Is able to step over 2 stacked shoe boxes taped together (9 in total height) without changing gait speed. No evidence of imbalance.    Gait with Narrow Base of Support Is able to ambulate for 10 steps heel to toe with no staggering.    Gait with Eyes Closed Walks 20 ft, no assistive devices, good speed, no evidence of imbalance, normal gait pattern, deviates no more than 6 in outside 12 in walkway width. Ambulates 20 ft in less than 7 sec.    Ambulating Backwards Walks 20 ft, no assistive devices, good speed, no evidence for imbalance, normal gait    Steps Alternating feet, no rail.    Total Score 29                PATIENT EDUCATION: Education details: Updated HEP; discontinued habituation exercises due to no c/o vertigo at this time 1)  Letter exercise - patterned background - 60 secs each direction;  2-3x day  2) Marching on pillows - in corner with chair in front - eyes open/closed with head turns  3) Practice walking with eyes closed with head turns along counter but try not to touch Person educated: Patient Education method: Explanation, demonstration, handout from [employer] comprehension: verbalized understanding, demonstrated understanding       Feet Apart (Compliant Surface) Head Motion - Eyes Closed    Stand on compliant surface: ____pillows____ with feet shoulder width  apart. Close eyes and move head slowly, up and down. Repeat __1__ times per session. Do _1-2___ sessions per day.  Copyright  VHI. All rights reserved.   GOALS: Goals reviewed with patient? Yes   SHORT TERM GOALS= LTG based on POC length    LONG TERM GOALS: Target date: 04/14/2022     Pt will be independent with final HEP for improved balance  Baseline: to be provided; provided Goal status: MET   2.  Pt will improve FGA to >/= 28/30 to demonstrate improved balance and reduced fall risk  Baseline: 23/30; 29/30 Goal status: MET   3.  Patient will improve time on condition 4 of M-CTSIB to >/=30s Baseline: 7S; 30s Goal status: MET   4.  Pt will report </= 1/5 for all movements on MSQ to indicate improvement in motion sensitivity and improved activity tolerance.    Baseline: 2/5; 0/5 Goal status: MET   5. Patient will score >/= 66 on FOTO assessment to demonstrate reduction in symptoms            Baseline: 60; 67            Goal status: MET   ASSESSMENT:   CLINICAL IMPRESSION: Patient seen for skilled PT session with emphasis on goal assessment and discharge. Upon final evaluation, BPPV remains clear with negative positional testing. Patient demonstrates increased fall risk as noted by score of 29/30 on  Functional Gait Assessment.   <22/30 = predictive of falls, <20/30 = fall in 6 months, <18/30 = predictive of falls in PD MCID: 5 points stroke population, 4 points geriatric population (ANPTA Core Set of Outcome Measures for Adults with Neurologic Conditions, 2018). Patient able to remain in condition 4 of M-CTSIB for >30s with normal postural sway. Patient with no reports of dizziness throughout MSQ assessment. Scored a 67 on FOTO assessment surpassing expected score of 66. Patient to dc from PT at this time and may return with a new referral if needed.    OBJECTIVE IMPAIRMENTS Abnormal gait, decreased balance, decreased knowledge of condition, difficulty walking, and  dizziness.    ACTIVITY LIMITATIONS lifting, bending, standing, squatting, locomotion level, and caring for others   PARTICIPATION LIMITATIONS: cleaning, driving, shopping, community activity, and occupation   PERSONAL FACTORS Past/current experiences and 1 comorbidity: 1 prior episode of vertigo  are also affecting patient's functional outcome.    REHAB POTENTIAL: Good   CLINICAL DECISION MAKING: Evolving/moderate complexity   EVALUATION COMPLEXITY: Moderate     PLAN: PT FREQUENCY: 2x/week   PT DURATION: 4 weeks   PLANNED INTERVENTIONS: Therapeutic exercises, Therapeutic activity, Neuromuscular re-education, Balance training, Gait training, Patient/Family education, Self Care, Joint mobilization, Stair training, Vestibular training, Canalith repositioning, Visual/preceptual remediation/compensation, DME instructions, Manual therapy, and Re-evaluation   PLAN FOR NEXT SESSION:   discharged from [place], [place], PT, DPT, CBIS   04/15/2022, 1:37 PM

## 2022-10-13 ENCOUNTER — Other Ambulatory Visit: Payer: Self-pay | Admitting: Family Medicine

## 2022-10-13 DIAGNOSIS — N959 Unspecified menopausal and perimenopausal disorder: Secondary | ICD-10-CM

## 2022-10-15 ENCOUNTER — Other Ambulatory Visit: Payer: Self-pay | Admitting: Family Medicine

## 2022-10-15 DIAGNOSIS — K219 Gastro-esophageal reflux disease without esophagitis: Secondary | ICD-10-CM

## 2022-10-20 ENCOUNTER — Encounter: Payer: Managed Care, Other (non HMO) | Admitting: Family Medicine

## 2022-11-26 ENCOUNTER — Encounter: Payer: Self-pay | Admitting: Family Medicine

## 2022-11-26 ENCOUNTER — Ambulatory Visit (INDEPENDENT_AMBULATORY_CARE_PROVIDER_SITE_OTHER): Payer: Managed Care, Other (non HMO) | Admitting: Family Medicine

## 2022-11-26 ENCOUNTER — Other Ambulatory Visit: Payer: Self-pay | Admitting: Family Medicine

## 2022-11-26 VITALS — BP 128/88 | HR 76 | Ht 61.0 in | Wt 162.2 lb

## 2022-11-26 DIAGNOSIS — J4520 Mild intermittent asthma, uncomplicated: Secondary | ICD-10-CM

## 2022-11-26 DIAGNOSIS — Z6833 Body mass index (BMI) 33.0-33.9, adult: Secondary | ICD-10-CM

## 2022-11-26 DIAGNOSIS — N959 Unspecified menopausal and perimenopausal disorder: Secondary | ICD-10-CM | POA: Diagnosis not present

## 2022-11-26 DIAGNOSIS — H1013 Acute atopic conjunctivitis, bilateral: Secondary | ICD-10-CM | POA: Diagnosis not present

## 2022-11-26 DIAGNOSIS — E669 Obesity, unspecified: Secondary | ICD-10-CM

## 2022-11-26 DIAGNOSIS — K219 Gastro-esophageal reflux disease without esophagitis: Secondary | ICD-10-CM

## 2022-11-26 DIAGNOSIS — Z1212 Encounter for screening for malignant neoplasm of rectum: Secondary | ICD-10-CM

## 2022-11-26 DIAGNOSIS — Z1211 Encounter for screening for malignant neoplasm of colon: Secondary | ICD-10-CM

## 2022-11-26 DIAGNOSIS — J309 Allergic rhinitis, unspecified: Secondary | ICD-10-CM

## 2022-11-26 DIAGNOSIS — Z1231 Encounter for screening mammogram for malignant neoplasm of breast: Secondary | ICD-10-CM

## 2022-11-26 MED ORDER — AZELASTINE-FLUTICASONE 137-50 MCG/ACT NA SUSP
1.0000 | Freq: Two times a day (BID) | NASAL | 11 refills | Status: DC
Start: 2022-11-26 — End: 2023-12-20

## 2022-11-26 NOTE — Patient Instructions (Signed)
Please try a medication that combines azelastine and Fluticasone into one nasal spray.  Spray one spray into each nostril twice a day.  You can still sue the azelastine eye drops as needed.  Stop the Fluticasone spray.    If you wish to return to only taking Fluticasone, let Dr Jaryan Chicoine know.    Dr Kermit Arnette will talk with my pharmacist who is our local expert on preventing osteoporosis to see if and when to come off the Vivelle.   An order for Cologuard colon cancer test was sent to the company.  They should contact you to confirm your agreeing to the test.

## 2022-11-27 ENCOUNTER — Encounter: Payer: Self-pay | Admitting: Family Medicine

## 2022-11-27 NOTE — Assessment & Plan Note (Signed)
Established problem Uncontrolled.  Patient is not at goal of control of conjunctivitis component of allergic rhinoconjunctivitis Using Flonase regularly as directed and prn use of azelastine ophthalmic drops. Start: Dymista 1 spray each nostril twice a day Stop: Flonase Continue: prn azelastine ophthalmic drops.  If Dymista not a helpful for alleric rhinoconjunctivitis symptoms, requested patient to let office know so we can restart just her Flonase.

## 2022-11-27 NOTE — Progress Notes (Signed)
Sara Patrick is alone Sources of clinical information for visit is/are patient. Nursing assessment for this office visit was reviewed with the patient for accuracy and revision.     Previous Report(s) Reviewed: none     03/03/2022    8:47 AM  Depression screen PHQ 2/9  Decreased Interest 0  Down, Depressed, Hopeless 0  PHQ - 2 Score 0  Altered sleeping 0  Tired, decreased energy 0  Change in appetite 0  Feeling bad or failure about yourself  0  Trouble concentrating 1  Moving slowly or fidgety/restless 0  Suicidal thoughts 0  PHQ-9 Score 1  Difficult doing work/chores Not difficult at all   Flowsheet Row Office Visit from 03/03/2022 in [facility] Office Visit from 10/09/2021 in [facility] Office Visit from 09/12/2020 in [facility]  Thoughts that you would be better off dead, or of hurting yourself in some way Not at all Not at all Not at all  PHQ-9 Total Score 1 1 0          03/30/2014   10:15 AM  Fall Risk   Falls in the past year? No  Risk for fall due to : History of fall(s)       03/03/2022    8:47 AM 10/09/2021    2:09 PM 09/12/2020    8:43 AM  PHQ9 SCORE ONLY  PHQ-9 Total Score 1 1 0    There are no preventive care reminders to display for this patient.  Health Maintenance Due  Topic Date Due   Zoster Vaccines- Shingrix (1 of 2) Never done   Colonoscopy  Never done      History/P.E. limitations: none  There are no preventive care reminders to display for this patient. There are no preventive care reminders to display for this patient.  Health Maintenance Due  Topic Date Due   Zoster Vaccines- Shingrix (1 of 2) Never done   Colonoscopy  Never done     Chief Complaint  Patient presents with   Annual Exam     --------------------------------------------------------------------------------------------------------------------------------------------- Visit Problem List with A/P  No  problem-specific Assessment & Plan notes found for this encounter.

## 2022-11-27 NOTE — Assessment & Plan Note (Addendum)
Wt Readings from Last 3 Encounters:  11/26/22 162 lb 4 oz (73.6 kg)  03/03/22 163 lb 9.6 oz (74.2 kg)  10/09/21 167 lb 6.4 oz (75.9 kg)   Weight was 178 pounds 03.2021. Improved Sara Patrick emphasizing lean meats, vegetables and fruits in her diet.  Little to no sweets.  She walks on the treadmill at least three times a week.

## 2022-11-27 NOTE — Assessment & Plan Note (Addendum)
Established problem. Stable. No signs of complications, medication side effects, or red flags.  Recommend continuing Vivelle-Dot patch, 0.1 mg/24h, change patch twice a day Will plan to start titrating off the Estradiol patch at next annual visit.  May need to use titrate using lower dose Vivelle patches.

## 2022-11-27 NOTE — Assessment & Plan Note (Signed)
Established problem Well Controlled. Patient is at goal of being asymptomatic. No signs of complications, medication side effects, or red flags. Continue current medications and other regiments.

## 2022-11-27 NOTE — Assessment & Plan Note (Signed)
Established problem Well Controlled. Patient is at goal of no asthma symptoms. Ms Winterhalter has not had to use her SABA since she saw me last year.  Albuteral MRI 2 p q6h prn shortness of breath, wheezing

## 2022-12-14 LAB — COLOGUARD: COLOGUARD: NEGATIVE

## 2022-12-18 ENCOUNTER — Ambulatory Visit
Admission: RE | Admit: 2022-12-18 | Discharge: 2022-12-18 | Disposition: A | Discharge status: Home / Self Care | Payer: Managed Care, Other (non HMO) | Source: Ambulatory Visit

## 2022-12-18 DIAGNOSIS — Z1231 Encounter for screening mammogram for malignant neoplasm of breast: Secondary | ICD-10-CM

## 2023-08-14 ENCOUNTER — Other Ambulatory Visit: Payer: Self-pay | Admitting: Family Medicine

## 2023-08-14 DIAGNOSIS — J4520 Mild intermittent asthma, uncomplicated: Secondary | ICD-10-CM

## 2023-10-28 ENCOUNTER — Encounter: Payer: Self-pay | Admitting: Family Medicine

## 2023-10-28 DIAGNOSIS — K219 Gastro-esophageal reflux disease without esophagitis: Secondary | ICD-10-CM

## 2023-10-28 MED ORDER — FAMOTIDINE 40 MG PO TABS
ORAL_TABLET | ORAL | 0 refills | Status: DC
Start: 2023-10-28 — End: 2024-01-31

## 2023-11-21 ENCOUNTER — Other Ambulatory Visit: Payer: Self-pay | Admitting: Family Medicine

## 2023-11-21 DIAGNOSIS — N959 Unspecified menopausal and perimenopausal disorder: Secondary | ICD-10-CM

## 2023-12-10 ENCOUNTER — Other Ambulatory Visit: Payer: Self-pay | Admitting: Family Medicine

## 2023-12-10 DIAGNOSIS — Z1231 Encounter for screening mammogram for malignant neoplasm of breast: Secondary | ICD-10-CM

## 2023-12-10 DIAGNOSIS — J302 Other seasonal allergic rhinitis: Secondary | ICD-10-CM

## 2023-12-18 ENCOUNTER — Other Ambulatory Visit: Payer: Self-pay | Admitting: Family Medicine

## 2023-12-18 DIAGNOSIS — H1013 Acute atopic conjunctivitis, bilateral: Secondary | ICD-10-CM

## 2024-01-07 ENCOUNTER — Encounter

## 2024-01-07 DIAGNOSIS — Z1231 Encounter for screening mammogram for malignant neoplasm of breast: Secondary | ICD-10-CM

## 2024-01-11 ENCOUNTER — Other Ambulatory Visit: Payer: Self-pay | Admitting: Family Medicine

## 2024-01-11 DIAGNOSIS — Z1231 Encounter for screening mammogram for malignant neoplasm of breast: Secondary | ICD-10-CM

## 2024-01-28 ENCOUNTER — Ambulatory Visit
Admission: RE | Admit: 2024-01-28 | Discharge: 2024-01-28 | Disposition: A | Discharge status: Home / Self Care | Source: Ambulatory Visit

## 2024-01-28 DIAGNOSIS — Z1231 Encounter for screening mammogram for malignant neoplasm of breast: Secondary | ICD-10-CM

## 2024-01-29 ENCOUNTER — Other Ambulatory Visit: Payer: Self-pay | Admitting: Family Medicine

## 2024-01-29 DIAGNOSIS — K219 Gastro-esophageal reflux disease without esophagitis: Secondary | ICD-10-CM

## 2024-02-03 ENCOUNTER — Encounter: Payer: Self-pay | Admitting: Family Medicine

## 2024-02-03 ENCOUNTER — Ambulatory Visit (INDEPENDENT_AMBULATORY_CARE_PROVIDER_SITE_OTHER): Admitting: Family Medicine

## 2024-02-03 VITALS — BP 136/82 | HR 70 | Ht 61.0 in | Wt 170.0 lb

## 2024-02-03 DIAGNOSIS — E7800 Pure hypercholesterolemia, unspecified: Secondary | ICD-10-CM

## 2024-02-03 DIAGNOSIS — J4520 Mild intermittent asthma, uncomplicated: Secondary | ICD-10-CM

## 2024-02-03 DIAGNOSIS — H1013 Acute atopic conjunctivitis, bilateral: Secondary | ICD-10-CM

## 2024-02-03 DIAGNOSIS — Z23 Encounter for immunization: Secondary | ICD-10-CM

## 2024-02-03 DIAGNOSIS — Z79899 Other long term (current) drug therapy: Secondary | ICD-10-CM | POA: Diagnosis not present

## 2024-02-03 DIAGNOSIS — J309 Allergic rhinitis, unspecified: Secondary | ICD-10-CM

## 2024-02-03 DIAGNOSIS — K219 Gastro-esophageal reflux disease without esophagitis: Secondary | ICD-10-CM

## 2024-02-03 DIAGNOSIS — N959 Unspecified menopausal and perimenopausal disorder: Secondary | ICD-10-CM

## 2024-02-03 NOTE — Progress Notes (Signed)
 Sara Patrick is alone Sources of clinical information for visit is/are patient. Nursing assessment for this office visit was reviewed with the patient for accuracy and revision.   Previous Report(s) Reviewed: none     02/03/2024   10:35 AM  Depression screen PHQ 2/9  Decreased Interest 0  Down, Depressed, Hopeless 0  PHQ - 2 Score 0  Altered sleeping 1  Tired, decreased energy 0  Change in appetite 0  Feeling bad or failure about yourself  0  Trouble concentrating 0  Moving slowly or fidgety/restless 0  Suicidal thoughts 0  PHQ-9 Score 1  Difficult doing work/chores Not difficult at all   [employer] Office Visit from 02/03/2024 in [facility] Of [place] Office Visit from 03/03/2022 in [facility] - [facility] Dept Of [place]. [facility] Office Visit from 10/09/2021 in [facility] - [facility] Dept Of [place]. [facility]  Thoughts that you would be better off dead, or of hurting yourself in some way Not at all Not at all Not at all  PHQ-9 Total Score 1 1 1        03/30/2014   10:15 AM  Fall Risk   Falls in the past year? No   Risk for fall due to : History of fall(s)      Data saved with a previous flowsheet row definition       02/03/2024   10:35 AM 03/03/2022    8:47 AM 10/09/2021    2:09 PM  PHQ9 SCORE ONLY  PHQ-9 Total Score 1 1 1     There are no preventive care reminders to display for this patient.  Health Maintenance Due  Topic Date Due   Hepatitis B Vaccines 19-59 Average Risk (1 of 3 - 19+ 3-dose series) Never done   Zoster Vaccines- Shingrix (1 of 2) Never done   INFLUENZA VACCINE  01/21/2024      History/P.E. limitations: none  There are no preventive care reminders to display for this patient. There are no preventive care reminders to display for this patient.  Health Maintenance Due  Topic Date Due   Hepatitis B Vaccines 19-59 Average Risk (1 of 3 - 19+ 3-dose series)  Never done   Zoster Vaccines- Shingrix (1 of 2) Never done   INFLUENZA VACCINE  01/21/2024     Chief Complaint  Patient presents with   Annual Exam     Discussed the use of AI scribe software for clinical note transcription with the patient, who gave verbal consent to proceed.  History of Present Illness      SDOH Screenings   Depression (PHQ2-9): Low Risk  (02/03/2024)  Physical Activity: Sufficiently Active (07/29/2018)  Stress: No Stress Concern Present (07/29/2018)  Tobacco Use: Low Risk  (02/03/2024)   --------------------------------------------------------------------------------------------------------------------------------------------- Visit Problem List with Assessment and Plan   Assessment and Plan Assessment & Plan      Mild intermittent asthma Established problem Well Controlled. Patient is at goal of no asthma symptoms. [name] has not had to use her SABA since she saw me last year.  Albuteral MRI 2 p q6h prn shortness of breath, wheezing    Menopausal and postmenopausal disorder Established problem S/P TAH BSO 2007. Has been on HRT since surgery After discussion of risks and benefits of HRT, Shared decision making, [name] chose to stop the Vivelle and monitor for menopausal symptoms.  If intolerable  sxs then will seek to find lowest effective HRT.   Chronic gastroesophageal reflux disease Established problem Well Controlled. Patient is at goal of being asymptomatic. No signs of complications, medication side effects, or red flags. Continue current medications and other regiments.    Allergic conjunctivitis and rhinitis Established problem Well Controlled. Patient is at goal of sxs control. No signs of complications, medication side effects, or red flags. Continue Dymista  daily and azelastine  ophthalmology drops prn

## 2024-02-03 NOTE — Patient Instructions (Signed)
 Let's try stopping the Vivelle .  If you start getting intolerable hot flashes or hot flashes that interrupting your sleep, send Dr Libertie Hausler a message using MyChart.   We can figure out the lowest dose of estrogen you need to control the symptoms.   We are checking your Kidneys, liver, electrolytes, blood sugar and cholesterol.   Dr Yalonda Sample will let you know it they are abnormal.  If they are normal, he will send your a message through your MyChart

## 2024-02-04 ENCOUNTER — Encounter: Payer: Self-pay | Admitting: Family Medicine

## 2024-02-04 LAB — CMP14+EGFR
ALT: 25 IU/L (ref 0–32)
AST: 45 IU/L — ABNORMAL HIGH (ref 0–40)
Albumin: 4.6 g/dL (ref 3.8–4.9)
Alkaline Phosphatase: 63 IU/L (ref 44–121)
BUN/Creatinine Ratio: 27 — ABNORMAL HIGH (ref 9–23)
BUN: 19 mg/dL (ref 6–24)
Bilirubin Total: 0.3 mg/dL (ref 0.0–1.2)
CO2: 22 mmol/L (ref 20–29)
Calcium: 9.2 mg/dL (ref 8.7–10.2)
Chloride: 100 mmol/L (ref 96–106)
Creatinine, Ser: 0.7 mg/dL (ref 0.57–1.00)
Globulin, Total: 2.8 g/dL (ref 1.5–4.5)
Glucose: 99 mg/dL (ref 70–99)
Potassium: 4.4 mmol/L (ref 3.5–5.2)
Sodium: 135 mmol/L (ref 134–144)
Total Protein: 7.4 g/dL (ref 6.0–8.5)
eGFR: 103 mL/min/1.73 (ref >59)

## 2024-02-04 LAB — LDL CHOLESTEROL, DIRECT: LDL Direct: 126 mg/dL — ABNORMAL HIGH (ref 0–99)

## 2024-02-04 NOTE — Assessment & Plan Note (Signed)
Established problem Well Controlled. Patient is at goal of no asthma symptoms. Sara Patrick has not had to use her SABA since she saw me last year.  Albuteral MRI 2 p q6h prn shortness of breath, wheezing

## 2024-02-04 NOTE — Assessment & Plan Note (Signed)
 Established problem Well Controlled. Patient is at goal of sxs control. No signs of complications, medication side effects, or red flags. Continue Dymista  daily and azelastine  ophthalmology drops prn

## 2024-02-04 NOTE — Assessment & Plan Note (Signed)
 Established problem S/P TAH BSO 2007. Has been on HRT since surgery After discussion of risks and benefits of HRT, Shared decision making, Sara Patrick chose to stop the Vivelle  and monitor for menopausal symptoms.  If intolerable sxs then will seek to find lowest effective HRT.

## 2024-02-04 NOTE — Assessment & Plan Note (Signed)
Established problem Well Controlled. Patient is at goal of being asymptomatic. No signs of complications, medication side effects, or red flags. Continue current medications and other regiments.

## 2024-02-07 ENCOUNTER — Ambulatory Visit: Payer: Self-pay | Admitting: Family Medicine
